# Patient Record
Sex: Female | Born: 1980 | Race: Black or African American | Hispanic: No | Marital: Single | Smoking: Current every day smoker
Health system: Southern US, Community
[De-identification: ages and names within clinical notes are randomized; demographics above are authoritative.]

## PROBLEM LIST (undated history)

## (undated) DIAGNOSIS — F419 Anxiety disorder, unspecified: Secondary | ICD-10-CM

## (undated) DIAGNOSIS — K649 Unspecified hemorrhoids: Secondary | ICD-10-CM

## (undated) HISTORY — DX: Unspecified hemorrhoids: K64.9

## (undated) HISTORY — PX: NO PAST SURGERIES: SHX2092

---
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
## 2004-10-12 ENCOUNTER — Ambulatory Visit (HOSPITAL_COMMUNITY): Admission: RE | Admit: 2004-10-12 | Discharge: 2004-10-12 | Payer: Self-pay | Admitting: *Deleted

## 2007-10-25 ENCOUNTER — Inpatient Hospital Stay (HOSPITAL_COMMUNITY): Admission: AD | Admit: 2007-10-25 | Discharge: 2007-10-26 | Payer: Self-pay | Admitting: Obstetrics and Gynecology

## 2008-05-30 ENCOUNTER — Inpatient Hospital Stay (HOSPITAL_COMMUNITY): Admission: AD | Admit: 2008-05-30 | Discharge: 2008-06-02 | Payer: Self-pay | Admitting: Obstetrics and Gynecology

## 2008-05-31 ENCOUNTER — Encounter (INDEPENDENT_AMBULATORY_CARE_PROVIDER_SITE_OTHER): Payer: Self-pay | Admitting: Obstetrics and Gynecology

## 2010-06-24 LAB — COMPREHENSIVE METABOLIC PANEL
AST: 21 U/L (ref 0–37)
Albumin: 2.2 g/dL — ABNORMAL LOW (ref 3.5–5.2)
Alkaline Phosphatase: 717 U/L — ABNORMAL HIGH (ref 39–117)
BUN: 5 mg/dL — ABNORMAL LOW (ref 6–23)
BUN: 6 mg/dL (ref 6–23)
CO2: 22 mEq/L (ref 19–32)
CO2: 23 mEq/L (ref 19–32)
Chloride: 107 mEq/L (ref 96–112)
Chloride: 109 mEq/L (ref 96–112)
Creatinine, Ser: 0.55 mg/dL (ref 0.4–1.2)
GFR calc Af Amer: >60 mL/min (ref >60)
GFR calc Af Amer: >60 mL/min (ref >60)
GFR calc non Af Amer: >60 mL/min (ref >60)
GFR calc non Af Amer: >60 mL/min (ref >60)
Glucose, Bld: 101 mg/dL — ABNORMAL HIGH (ref 70–99)
Glucose, Bld: 76 mg/dL (ref 70–99)
Glucose, Bld: 89 mg/dL (ref 70–99)
Potassium: 3.8 mEq/L (ref 3.5–5.1)
Potassium: 3.9 mEq/L (ref 3.5–5.1)
Sodium: 136 mEq/L (ref 135–145)
Sodium: 136 mEq/L (ref 135–145)
Total Bilirubin: 0.5 mg/dL (ref 0.3–1.2)
Total Protein: 5.7 g/dL — ABNORMAL LOW (ref 6.0–8.3)

## 2010-06-24 LAB — RPR: RPR Ser Ql: REACTIVE — AB

## 2010-06-24 LAB — URINALYSIS, ROUTINE W REFLEX MICROSCOPIC
Bilirubin Urine: NEGATIVE
Nitrite: NEGATIVE
Protein, ur: NEGATIVE mg/dL
Specific Gravity, Urine: 1.025 (ref 1.005–1.030)
pH: 6 (ref 5.0–8.0)

## 2010-06-24 LAB — RH IMMUNE GLOB WKUP(>/=20WKS)(NOT WOMEN'S HOSP): Fetal Screen: NEGATIVE

## 2010-06-24 LAB — CBC
Hemoglobin: 12.1 g/dL (ref 12.0–15.0)
Hemoglobin: 12.9 g/dL (ref 12.0–15.0)
Hemoglobin: 13.3 g/dL (ref 12.0–15.0)
MCHC: 33.7 g/dL (ref 30.0–36.0)
MCV: 93.6 fL (ref 78.0–100.0)
MCV: 94.1 fL (ref 78.0–100.0)
Platelets: 200 10*3/uL (ref 150–400)
Platelets: 221 10*3/uL (ref 150–400)
RDW: 13.9 % (ref 11.5–15.5)
WBC: 11.4 10*3/uL — ABNORMAL HIGH (ref 4.0–10.5)

## 2010-06-24 LAB — URIC ACID
Uric Acid, Serum: 4.4 mg/dL (ref 2.4–7.0)
Uric Acid, Serum: 5.3 mg/dL (ref 2.4–7.0)

## 2010-06-24 LAB — LACTATE DEHYDROGENASE: LDH: 283 U/L — ABNORMAL HIGH (ref 94–250)

## 2010-12-10 LAB — URINALYSIS, ROUTINE W REFLEX MICROSCOPIC
Glucose, UA: NEGATIVE
Ketones, ur: 15 — AB
Specific Gravity, Urine: 1.025

## 2010-12-10 LAB — POCT PREGNANCY, URINE: Preg Test, Ur: POSITIVE

## 2010-12-10 LAB — GC/CHLAMYDIA PROBE AMP, GENITAL: GC Probe Amp, Genital: NEGATIVE

## 2012-12-10 ENCOUNTER — Other Ambulatory Visit: Payer: Self-pay | Admitting: Obstetrics and Gynecology

## 2012-12-10 LAB — OB RESULTS CONSOLE GC/CHLAMYDIA
Chlamydia: NEGATIVE
Gonorrhea: NEGATIVE

## 2013-01-02 LAB — OB RESULTS CONSOLE HEPATITIS B SURFACE ANTIGEN: HEP B S AG: NEGATIVE

## 2013-01-02 LAB — OB RESULTS CONSOLE RUBELLA ANTIBODY, IGM: RUBELLA: IMMUNE

## 2013-01-02 LAB — OB RESULTS CONSOLE HIV ANTIBODY (ROUTINE TESTING): HIV: NONREACTIVE

## 2013-01-02 LAB — OB RESULTS CONSOLE RPR: RPR: NONREACTIVE

## 2013-02-13 ENCOUNTER — Other Ambulatory Visit (HOSPITAL_COMMUNITY): Payer: Self-pay | Admitting: Obstetrics and Gynecology

## 2013-02-13 ENCOUNTER — Ambulatory Visit (HOSPITAL_COMMUNITY)
Admission: RE | Admit: 2013-02-13 | Discharge: 2013-02-13 | Disposition: A | Discharge status: Home / Self Care | Payer: 59 | Source: Ambulatory Visit | Attending: Obstetrics and Gynecology | Admitting: Obstetrics and Gynecology

## 2013-02-13 DIAGNOSIS — R109 Unspecified abdominal pain: Secondary | ICD-10-CM

## 2013-02-13 DIAGNOSIS — Z3689 Encounter for other specified antenatal screening: Secondary | ICD-10-CM | POA: Insufficient documentation

## 2013-03-14 NOTE — L&D Delivery Note (Signed)
Delivery Note Patient pushed for 11 minutes after she was noted to be C/C/+2. At 2:51 PM a viable and healthy female was delivered via Vaginal, Spontaneous Delivery (Presentation: Left Occiput Anterior).  APGAR: 8, 9; weight .   Placenta status: Intact, Spontaneous.  Cord: 3 vessels with the following complications: none  Anesthesia: Epidural  Episiotomy: None Lacerations: None Suture Repair: n/a Est. Blood Loss (mL): 300  Mom to postpartum.  Baby to Nursery.  Chaselynn Kepple 06/30/2013, 3:15 PM

## 2013-04-17 LAB — OB RESULTS CONSOLE ABO/RH: RH TYPE: NEGATIVE

## 2013-04-17 LAB — OB RESULTS CONSOLE ANTIBODY SCREEN: ANTIBODY SCREEN: POSITIVE

## 2013-06-03 LAB — OB RESULTS CONSOLE GBS: GBS: NEGATIVE

## 2013-06-28 ENCOUNTER — Inpatient Hospital Stay (HOSPITAL_COMMUNITY)
Admission: AD | Admit: 2013-06-28 | Discharge: 2013-06-28 | Disposition: A | Discharge status: Home / Self Care | Payer: Medicaid Other | Source: Ambulatory Visit | Attending: Obstetrics & Gynecology | Admitting: Obstetrics & Gynecology

## 2013-06-28 ENCOUNTER — Encounter (HOSPITAL_COMMUNITY): Payer: Self-pay

## 2013-06-28 DIAGNOSIS — O228X9 Other venous complications in pregnancy, unspecified trimester: Secondary | ICD-10-CM | POA: Insufficient documentation

## 2013-06-28 DIAGNOSIS — O479 False labor, unspecified: Secondary | ICD-10-CM | POA: Insufficient documentation

## 2013-06-28 DIAGNOSIS — K649 Unspecified hemorrhoids: Secondary | ICD-10-CM | POA: Insufficient documentation

## 2013-06-28 DIAGNOSIS — O99330 Smoking (tobacco) complicating pregnancy, unspecified trimester: Secondary | ICD-10-CM | POA: Insufficient documentation

## 2013-06-28 DIAGNOSIS — O2243 Hemorrhoids in pregnancy, third trimester: Secondary | ICD-10-CM

## 2013-06-28 MED ORDER — HYDROCORTISONE ACE-PRAMOXINE 1-1 % RE FOAM
1.0000 | Freq: Two times a day (BID) | RECTAL | Status: DC
Start: 2013-06-28 — End: 2013-06-30

## 2013-06-28 MED ORDER — DOCUSATE SODIUM 100 MG PO CAPS: 100.0000 mg | ORAL_CAPSULE | Freq: Two times a day (BID) | ORAL | Status: DC | PRN

## 2013-06-28 NOTE — MAU Note (Signed)
Patient is not in the lobby when called to triage.  

## 2013-06-28 NOTE — MAU Provider Note (Signed)
Chief Complaint:  Hemorrhoids   First Provider Initiated Contact with Patient 06/28/13 1436      HPI: Joyce Mcclure is a 33 y.o. [id number] at 9138w4dwho presents to maternity admissions reporting severe hemorrhoids.  She reports she cannot sit down because of the pain.  Last BM 2 days ago, but she reports she was not constipated until after the hemorrhoids because she is afraid of the pain.  She reports good fetal movement, denies regular contractions, LOF, vaginal bleeding, vaginal itching/burning, urinary symptoms, h/a, dizziness, n/v, or fever/chills.     Past Medical History: History reviewed. No pertinent past medical history.  Past obstetric history: OB History  Gravida Para Term Preterm AB SAB TAB Ectopic Multiple Living  4 1 1  2 1 1   1     # Outcome Date GA Lbr Len/2nd Weight Sex Delivery Anes PTL Lv  4 CUR           3 TAB           2 SAB           1 TRM      SVD   Y      Past Surgical History: History reviewed. No pertinent past surgical history.  Family History: History reviewed. No pertinent family history.  Social History: History  Substance Use Topics  . Smoking status: Current Every Day Smoker  . Smokeless tobacco: Not on file  . Alcohol Use: No    Allergies: No Known Allergies  Meds:  Prescriptions prior to admission  Medication Sig Dispense Refill  . Lidocaine HCl 3 % GEL Apply 1 application topically See admin instructions. Apply 4 to 6 times daily as needed for hemorrhoids      . Prenatal Vit-Fe Fumarate-FA (PRENATAL MULTIVITAMIN) TABS tablet Take 1 tablet by mouth daily at 12 noon.        ROS: Pertinent findings in history of present illness.  Physical Exam  Blood pressure 124/68, pulse 85, temperature 98.9 F (37.2 C), temperature source Oral, resp. rate 16, height 5\' 5"  (1.651 m), weight 156 lb 9.6 oz (71.033 kg), SpO2 98.00%. GENERAL: Well-developed, well-nourished female in no acute distress.  HEENT: normocephalic HEART: normal rate RESP:  normal effort ABDOMEN: Soft, non-tender, gravid appropriate for gestational age EXTREMITIES: Nontender, no edema NEURO: alert and oriented Rectal - external hemorrhoids non-thrombosed    FHT:  Baseline 130, moderate variability, accelerations present, no decelerations Contractions: occasional    Assessment: 1. Hemorrhoids in pregnancy in third trimester     Plan: Consult Dr Mora ApplPinn Discharge home Labor precautions and fetal kick counts Proctofoam HC and Colace Rx to pharmacy F/U in office on Monday if symptoms persist or worsen      Follow-up Information   [facility] Sanjuana MaeWALDA STACIA, MD. (Call office on Monday if symptoms persist. )    Specialty:  Obstetrics and Gynecology   Contact information:   [address] [place] [address] [phone]        Medication List    STOP taking these medications       Lidocaine HCl 3 % Gel      TAKE these medications       docusate sodium 100 MG capsule  Commonly known as:  COLACE  Take 1 capsule (100 mg total) by mouth 2 (two) times daily as needed.     hydrocortisone-pramoxine rectal foam  Commonly known as:  PROCTOFOAM HC  Place 1 applicator rectally 2 (two) times daily.  prenatal multivitamin Tabs tablet  Take 1 tablet by mouth daily at 12 noon.        Sharen CounterLisa Leftwich-Kirby Certified Nurse-Midwife 06/28/2013 3:42 PM

## 2013-06-28 NOTE — MAU Note (Signed)
Patient states she has had hemorrhoids for a few years but worse with the pregnancy. Was seen in the office yesterday and given Lidocaine gel that does not help. Denies bleeding, leaking and has irregular mild contractions. Reports good fetal movement.

## 2013-06-28 NOTE — Discharge Instructions (Signed)
Hemorrhoids °Hemorrhoids are swollen veins around the rectum or anus. There are two types of hemorrhoids:  °· Internal hemorrhoids. These occur in the veins just inside the rectum. They may poke through to the outside and become irritated and painful. °· External hemorrhoids. These occur in the veins outside the anus and can be felt as a painful swelling or hard lump near the anus. °CAUSES °· Pregnancy.   °· Obesity.   °· Constipation or diarrhea.   °· Straining to have a bowel movement.   °· Sitting for long periods on the toilet. °· Heavy lifting or other activity that caused you to strain. °· Anal intercourse. °SYMPTOMS  °· Pain.   °· Anal itching or irritation.   °· Rectal bleeding.   °· Fecal leakage.   °· Anal swelling.   °· One or more lumps around the anus.   °DIAGNOSIS  °Your caregiver may be able to diagnose hemorrhoids by visual examination. Other examinations or tests that may be performed include:  °· Examination of the rectal area with a gloved hand (digital rectal exam).   °· Examination of anal canal using a small tube (scope).   °· A blood test if you have lost a significant amount of blood. °· A test to look inside the colon (sigmoidoscopy or colonoscopy). °TREATMENT °Most hemorrhoids can be treated at home. However, if symptoms do not seem to be getting better or if you have a lot of rectal bleeding, your caregiver may perform a procedure to help make the hemorrhoids get smaller or remove them completely. Possible treatments include:  °· Placing a rubber band at the base of the hemorrhoid to cut off the circulation (rubber band ligation).   °· Injecting a chemical to shrink the hemorrhoid (sclerotherapy).   °· Using a tool to burn the hemorrhoid (infrared light therapy).   °· Surgically removing the hemorrhoid (hemorrhoidectomy).   °· Stapling the hemorrhoid to block blood flow to the tissue (hemorrhoid stapling).   °HOME CARE INSTRUCTIONS  °· Eat foods with fiber, such as whole grains, beans,  nuts, fruits, and vegetables. Ask your doctor about taking products with added fiber in them (fiber supplements). °· Increase fluid intake. Drink enough water and fluids to keep your urine clear or pale yellow.   °· Exercise regularly.   °· Go to the bathroom when you have the urge to have a bowel movement. Do not wait.   °· Avoid straining to have bowel movements.   °· Keep the anal area dry and clean. Use wet toilet paper or moist towelettes after a bowel movement.   °· Medicated creams and suppositories may be used or applied as directed.   °· Only take over-the-counter or prescription medicines as directed by your caregiver.   °· Take warm sitz baths for 15 20 minutes, 3 4 times a day to ease pain and discomfort.   °· Place ice packs on the hemorrhoids if they are tender and swollen. Using ice packs between sitz baths may be helpful.   °· Put ice in a plastic bag.   °· Place a towel between your skin and the bag.   °· Leave the ice on for 15 20 minutes, 3 4 times a day.   °· Do not use a donut-shaped pillow or sit on the toilet for long periods. This increases blood pooling and pain.   °SEEK MEDICAL CARE IF: °· You have increasing pain and swelling that is not controlled by treatment or medicine. °· You have uncontrolled bleeding. °· You have difficulty or you are unable to have a bowel movement. °· You have pain or inflammation outside the area of the hemorrhoids. °MAKE SURE YOU: °·   Understand these instructions.  Will watch your condition.  Will get help right away if you are not doing well or get worse. Document Released: 02/26/2000 Document Revised: 02/15/2012 Document Reviewed: 01/03/2012 [facility] Patient Information 2014 [place], [place].  Fiber Content in Foods Drinking plenty of fluids and consuming foods high in fiber can help with constipation. See the list below for the fiber content of some common foods. Starches and Grains / Dietary Fiber (g)  Raisin Bran, 1 cup/ 7 g  Cheerios, 1 cup /  3 g  Kellogg's Corn Flakes, 1 cup / 0.7 g  Rice Krispies, 1  cup / 0.3 g  Quaker Oat Life Cereal,  cup / 2.1 g  Oatmeal, instant (cooked),  cup / 2 g  Kellogg's Frosted Mini Wheats, 1 cup / 5.1 g  Rice, brown, long-grain (cooked), 1 cup / 3.5 g  Rice, white, long-grain (cooked), 1 cup / 0.6 g  Macaroni, cooked, enriched, 1 cup / 2.5 g Legumes / Dietary Fiber (g)  Beans, baked, canned, plain or vegetarian,  cup / 5.2 g  Beans, kidney, canned,  cup / 6.8 g  Beans, pinto, dried (cooked),  cup / 7.7 g  Beans, pinto, canned,  cup / 5.5 g Breads and Crackers / Dietary Fiber (g)  Graham crackers, plain or honey, 2 squares / 0.7 g  Saltine crackers, 3 squares / 0.3 g  Pretzels, plain, salted, 10 pieces / 1.8 g  Bread, whole-wheat, 1 slice / 1.9 g  Bread, white, 1 slice / 0.7 g  Bread, raisin, 1 slice / 1.2 g  Bagel, plain, 3 oz / 2 g  Tortilla, flour, 1 oz / 0.9 g  Tortilla, corn, 1 small / 1.5 g  Bun, hamburger or hotdog, 1 small / 0.9 g Fruits / Dietary Fiber (g)  Apple, raw with skin, 1 medium / 4.4 g  Applesauce, sweetened,  cup / 1.5 g  Banana,  medium / 1.5 g  Grapes, 10 grapes / 0.4 g  Orange, 1 small / 2.3 g  Raisin, 1.5 oz / 1.6 g  Melon, 1 cup / 1.4 g Vegetables / Dietary Fiber (g)  Green beans, canned,  cup / 1.3 g  Carrots (cooked),  cup / 2.3 g  Broccoli (cooked),  cup / 2.8 g  Peas, frozen (cooked),  cup / 4.4 g  Potatoes, mashed,  cup / 1.6 g  Lettuce, 1 cup / 0.5 g  Corn, canned,  cup / 1.6 g  Tomato,  cup / 1.1 g Document Released: 07/17/2006 Document Revised: 05/23/2011 Document Reviewed: 09/11/2006 ExitCare Patient Information 2014 [employer], [place].

## 2013-06-29 ENCOUNTER — Inpatient Hospital Stay (HOSPITAL_COMMUNITY)
Admission: AD | Admit: 2013-06-29 | Discharge: 2013-07-02 | DRG: 775 | Disposition: A | Discharge status: Home / Self Care | Payer: Medicaid Other | Source: Ambulatory Visit | Attending: Obstetrics & Gynecology | Admitting: Obstetrics & Gynecology

## 2013-06-29 ENCOUNTER — Encounter (HOSPITAL_COMMUNITY): Payer: Self-pay

## 2013-06-29 DIAGNOSIS — K649 Unspecified hemorrhoids: Secondary | ICD-10-CM | POA: Diagnosis present

## 2013-06-29 DIAGNOSIS — O99334 Smoking (tobacco) complicating childbirth: Secondary | ICD-10-CM | POA: Diagnosis present

## 2013-06-29 DIAGNOSIS — O360990 Maternal care for other rhesus isoimmunization, unspecified trimester, not applicable or unspecified: Secondary | ICD-10-CM | POA: Diagnosis present

## 2013-06-29 DIAGNOSIS — IMO0001 Reserved for inherently not codable concepts without codable children: Secondary | ICD-10-CM

## 2013-06-29 DIAGNOSIS — O878 Other venous complications in the puerperium: Secondary | ICD-10-CM | POA: Diagnosis present

## 2013-06-29 NOTE — MAU Note (Signed)
Contractions 4-6 min apart for past hour, ctxs through day since mucus plug came out this morning.  Pinkish spotting since plug.  Reports good baby movement.

## 2013-06-29 NOTE — MAU Provider Note (Signed)
Discussed case with CNM I agree with above.   Joyce DienerWalda Briget Shaheed

## 2013-06-30 ENCOUNTER — Encounter (HOSPITAL_COMMUNITY): Payer: Self-pay | Admitting: *Deleted

## 2013-06-30 ENCOUNTER — Encounter (HOSPITAL_COMMUNITY): Payer: Medicaid Other | Admitting: Anesthesiology

## 2013-06-30 ENCOUNTER — Inpatient Hospital Stay (HOSPITAL_COMMUNITY): Payer: Medicaid Other | Admitting: Anesthesiology

## 2013-06-30 DIAGNOSIS — IMO0001 Reserved for inherently not codable concepts without codable children: Secondary | ICD-10-CM

## 2013-06-30 LAB — CBC
HEMATOCRIT: 36.3 % (ref 36.0–46.0)
Hemoglobin: 12.5 g/dL (ref 12.0–15.0)
MCH: 30.6 pg (ref 26.0–34.0)
MCHC: 34.4 g/dL (ref 30.0–36.0)
MCV: 88.8 fL (ref 78.0–100.0)
Platelets: 283 10*3/uL (ref 150–400)
RBC: 4.09 MIL/uL (ref 3.87–5.11)
RDW: 13.9 % (ref 11.5–15.5)
WBC: 14.8 10*3/uL — ABNORMAL HIGH (ref 4.0–10.5)

## 2013-06-30 LAB — RPR

## 2013-06-30 MED ORDER — CITRIC ACID-SODIUM CITRATE 334-500 MG/5ML PO SOLN: 30.0000 mL | ORAL | Status: DC | PRN

## 2013-06-30 MED ORDER — FENTANYL CITRATE 0.05 MG/ML IJ SOLN
100.0000 ug | INTRAMUSCULAR | Status: DC | PRN
  Administered 2013-06-30 (×3): 100 ug via INTRAVENOUS
  Filled 2013-06-30 (×3): qty 2

## 2013-06-30 MED ORDER — LANOLIN HYDROUS EX OINT: TOPICAL_OINTMENT | CUTANEOUS | Status: DC | PRN

## 2013-06-30 MED ORDER — TETANUS-DIPHTH-ACELL PERTUSSIS 5-2.5-18.5 LF-MCG/0.5 IM SUSP: 0.5000 mL | Freq: Once | INTRAMUSCULAR | Status: DC

## 2013-06-30 MED ORDER — LACTATED RINGERS IV SOLN
INTRAVENOUS | Status: DC
  Administered 2013-06-30 (×2): via INTRAVENOUS

## 2013-06-30 MED ORDER — LACTATED RINGERS IV SOLN: 500.0000 mL | INTRAVENOUS | Status: DC | PRN

## 2013-06-30 MED ORDER — BENZOCAINE-MENTHOL 20-0.5 % EX AERO
1.0000 "application " | INHALATION_SPRAY | CUTANEOUS | Status: DC | PRN
  Administered 2013-06-30: 1 via TOPICAL
  Filled 2013-06-30: qty 56

## 2013-06-30 MED ORDER — HYDROCORTISONE ACE-PRAMOXINE 1-1 % RE FOAM
1.0000 | Freq: Two times a day (BID) | RECTAL | Status: DC | PRN
  Administered 2013-07-01: 1 via RECTAL
  Filled 2013-06-30: qty 10

## 2013-06-30 MED ORDER — OXYTOCIN 40 UNITS IN LACTATED RINGERS INFUSION - SIMPLE MED
62.5000 mL/h | INTRAVENOUS | Status: DC
  Filled 2013-06-30: qty 1000

## 2013-06-30 MED ORDER — SODIUM CHLORIDE 0.9 % IV SOLN
INTRAVENOUS | Status: DC
  Administered 2013-06-30: 12:00:00 via EPIDURAL
  Filled 2013-06-30 (×3): qty 20

## 2013-06-30 MED ORDER — PRAMOXINE HCL 1 % RE FOAM: Freq: Three times a day (TID) | RECTAL | Status: DC | PRN

## 2013-06-30 MED ORDER — DIBUCAINE 1 % RE OINT
1.0000 "application " | TOPICAL_OINTMENT | RECTAL | Status: DC | PRN
  Administered 2013-06-30: 1 via RECTAL
  Filled 2013-06-30: qty 28

## 2013-06-30 MED ORDER — IBUPROFEN 600 MG PO TABS: 600.0000 mg | ORAL_TABLET | Freq: Four times a day (QID) | ORAL | Status: DC | PRN

## 2013-06-30 MED ORDER — DIPHENHYDRAMINE HCL 50 MG/ML IJ SOLN
12.5000 mg | INTRAMUSCULAR | Status: DC | PRN
  Administered 2013-06-30 (×2): 12.5 mg via INTRAVENOUS
  Filled 2013-06-30: qty 1

## 2013-06-30 MED ORDER — ONDANSETRON HCL 4 MG/2ML IJ SOLN: 4.0000 mg | Freq: Four times a day (QID) | INTRAMUSCULAR | Status: DC | PRN

## 2013-06-30 MED ORDER — PHENYLEPHRINE 40 MCG/ML (10ML) SYRINGE FOR IV PUSH (FOR BLOOD PRESSURE SUPPORT)
80.0000 ug | PREFILLED_SYRINGE | INTRAVENOUS | Status: DC | PRN
  Filled 2013-06-30: qty 2
  Filled 2013-06-30: qty 10

## 2013-06-30 MED ORDER — DIPHENHYDRAMINE HCL 25 MG PO CAPS: 25.0000 mg | ORAL_CAPSULE | Freq: Four times a day (QID) | ORAL | Status: DC | PRN

## 2013-06-30 MED ORDER — FENTANYL 2.5 MCG/ML BUPIVACAINE 1/10 % EPIDURAL INFUSION (WH - ANES)
INTRAMUSCULAR | Status: DC | PRN
  Administered 2013-06-30: 14 mL/h via EPIDURAL

## 2013-06-30 MED ORDER — IBUPROFEN 600 MG PO TABS
600.0000 mg | ORAL_TABLET | Freq: Four times a day (QID) | ORAL | Status: DC
  Administered 2013-06-30 – 2013-07-02 (×7): 600 mg via ORAL
  Filled 2013-06-30 (×7): qty 1

## 2013-06-30 MED ORDER — WITCH HAZEL-GLYCERIN EX PADS
1.0000 "application " | MEDICATED_PAD | CUTANEOUS | Status: DC | PRN
  Administered 2013-06-30: 1 via TOPICAL

## 2013-06-30 MED ORDER — PHENYLEPHRINE 40 MCG/ML (10ML) SYRINGE FOR IV PUSH (FOR BLOOD PRESSURE SUPPORT)
80.0000 ug | PREFILLED_SYRINGE | INTRAVENOUS | Status: DC | PRN
  Filled 2013-06-30: qty 2

## 2013-06-30 MED ORDER — PNEUMOCOCCAL VAC POLYVALENT 25 MCG/0.5ML IJ INJ
0.5000 mL | INJECTION | INTRAMUSCULAR | Status: DC
  Filled 2013-06-30: qty 0.5

## 2013-06-30 MED ORDER — FLEET ENEMA 7-19 GM/118ML RE ENEM
1.0000 | ENEMA | RECTAL | Status: DC | PRN
Start: 2013-06-30 — End: 2013-06-30

## 2013-06-30 MED ORDER — SENNOSIDES-DOCUSATE SODIUM 8.6-50 MG PO TABS
2.0000 | ORAL_TABLET | ORAL | Status: DC
  Administered 2013-07-01 (×2): 2 via ORAL
  Filled 2013-06-30 (×2): qty 2

## 2013-06-30 MED ORDER — LACTATED RINGERS IV SOLN: INTRAVENOUS | Status: DC

## 2013-06-30 MED ORDER — OXYCODONE-ACETAMINOPHEN 5-325 MG PO TABS
1.0000 | ORAL_TABLET | ORAL | Status: DC | PRN
  Administered 2013-06-30: 1 via ORAL
  Administered 2013-07-01: 2 via ORAL
  Administered 2013-07-01: 1 via ORAL
  Administered 2013-07-02: 2 via ORAL
  Filled 2013-06-30: qty 2
  Filled 2013-06-30: qty 1
  Filled 2013-06-30 (×2): qty 2

## 2013-06-30 MED ORDER — BUTORPHANOL TARTRATE 1 MG/ML IJ SOLN
2.0000 mg | Freq: Once | INTRAMUSCULAR | Status: AC
  Administered 2013-06-30: 1 mg via INTRAMUSCULAR

## 2013-06-30 MED ORDER — ONDANSETRON HCL 4 MG/2ML IJ SOLN: 4.0000 mg | INTRAMUSCULAR | Status: DC | PRN

## 2013-06-30 MED ORDER — LIDOCAINE HCL (PF) 1 % IJ SOLN
INTRAMUSCULAR | Status: DC | PRN
  Administered 2013-06-30 (×2): 9 mL

## 2013-06-30 MED ORDER — OXYCODONE-ACETAMINOPHEN 5-325 MG PO TABS: 1.0000 | ORAL_TABLET | ORAL | Status: DC | PRN

## 2013-06-30 MED ORDER — LACTATED RINGERS IV SOLN
500.0000 mL | Freq: Once | INTRAVENOUS | Status: AC
  Administered 2013-06-30: 500 mL via INTRAVENOUS

## 2013-06-30 MED ORDER — FENTANYL 2.5 MCG/ML BUPIVACAINE 1/10 % EPIDURAL INFUSION (WH - ANES)
14.0000 mL/h | INTRAMUSCULAR | Status: DC | PRN
  Administered 2013-06-30: 14 mL/h via EPIDURAL
  Filled 2013-06-30: qty 125

## 2013-06-30 MED ORDER — OXYTOCIN BOLUS FROM INFUSION
500.0000 mL | INTRAVENOUS | Status: DC
  Administered 2013-06-30: 500 mL via INTRAVENOUS

## 2013-06-30 MED ORDER — EPHEDRINE 5 MG/ML INJ
10.0000 mg | INTRAVENOUS | Status: DC | PRN
  Filled 2013-06-30: qty 2
  Filled 2013-06-30: qty 4

## 2013-06-30 MED ORDER — PRENATAL MULTIVITAMIN CH
1.0000 | ORAL_TABLET | Freq: Every day | ORAL | Status: DC
  Administered 2013-07-01: 1 via ORAL
  Filled 2013-06-30: qty 1

## 2013-06-30 MED ORDER — BUTORPHANOL TARTRATE 1 MG/ML IJ SOLN
INTRAMUSCULAR | Status: AC
  Filled 2013-06-30: qty 1

## 2013-06-30 MED ORDER — SIMETHICONE 80 MG PO CHEW: 80.0000 mg | CHEWABLE_TABLET | ORAL | Status: DC | PRN

## 2013-06-30 MED ORDER — EPHEDRINE 5 MG/ML INJ
10.0000 mg | INTRAVENOUS | Status: DC | PRN
  Filled 2013-06-30: qty 2

## 2013-06-30 MED ORDER — LIDOCAINE HCL (PF) 1 % IJ SOLN
30.0000 mL | INTRAMUSCULAR | Status: DC | PRN
  Filled 2013-06-30: qty 30

## 2013-06-30 MED ORDER — ACETAMINOPHEN 325 MG PO TABS: 650.0000 mg | ORAL_TABLET | ORAL | Status: DC | PRN

## 2013-06-30 MED ORDER — ZOLPIDEM TARTRATE 5 MG PO TABS: 5.0000 mg | ORAL_TABLET | Freq: Every evening | ORAL | Status: DC | PRN

## 2013-06-30 MED ORDER — ONDANSETRON HCL 4 MG PO TABS
4.0000 mg | ORAL_TABLET | ORAL | Status: DC | PRN
Start: 2013-06-30 — End: 2013-07-02

## 2013-06-30 NOTE — Anesthesia Preprocedure Evaluation (Signed)
Anesthesia Evaluation  Patient identified by MRN, date of birth, ID band Patient awake    Reviewed: Allergy & Precautions, H&P , NPO status , Patient's Chart, lab work & pertinent test results  Airway Mallampati: I TM Distance: >3 FB Neck ROM: full    Dental no notable dental hx.    Pulmonary Current Smoker,    Pulmonary exam normal       Cardiovascular negative cardio ROS      Neuro/Psych negative neurological ROS  negative psych ROS   GI/Hepatic negative GI ROS, Neg liver ROS,   Endo/Other  negative endocrine ROS  Renal/GU negative Renal ROS     Musculoskeletal   Abdominal Normal abdominal exam  (+)   Peds  Hematology negative hematology ROS (+)   Anesthesia Other Findings   Reproductive/Obstetrics (+) Pregnancy                           Anesthesia Physical Anesthesia Plan  ASA: II  Anesthesia Plan: Epidural   Post-op Pain Management:    Induction:   Airway Management Planned:   Additional Equipment:   Intra-op Plan:   Post-operative Plan:   Informed Consent: I have reviewed the patients History and Physical, chart, labs and discussed the procedure including the risks, benefits and alternatives for the proposed anesthesia with the patient or authorized representative who has indicated his/her understanding and acceptance.     Plan Discussed with:   Anesthesia Plan Comments:         Anesthesia Quick Evaluation  

## 2013-06-30 NOTE — Anesthesia Procedure Notes (Signed)
Epidural Patient location during procedure: OB Start time: 06/30/2013 7:23 AM End time: 06/30/2013 7:27 AM  Staffing Anesthesiologist: Leilani AbleHATCHETT, Adelma Bowdoin Performed by: anesthesiologist   Preanesthetic Checklist Completed: patient identified, surgical consent, pre-op evaluation, timeout performed, IV checked, risks and benefits discussed and monitors and equipment checked  Epidural Patient position: sitting Prep: site prepped and draped and DuraPrep Patient monitoring: continuous pulse ox and blood pressure Approach: midline Location: L3-L4 Injection technique: LOR air  Needle:  Needle type: Tuohy  Needle gauge: 17 G Needle length: 9 cm and 9 Needle insertion depth: 6 cm Catheter type: closed end flexible Catheter size: 19 Gauge Catheter at skin depth: 11 cm Test dose: negative and Other  Assessment Sensory level: T9 Events: blood not aspirated, injection not painful, no injection resistance, negative IV test and paresthesia  Additional Notes R leg X 2Reason for block:procedure for pain

## 2013-06-30 NOTE — Progress Notes (Signed)
Joyce Mcclure is a 33 y.o. [id number] at 3438w6d by LMP admitted for induction of labor due to Non-reactive NST.  Subjective:  Patient now comfortable with epidural  Objective: BP 113/75  Pulse 86  Temp(Src) 97.6 F (36.4 C) (Oral)  Resp 20  Ht 5\' 5"  (1.651 m)  Wt 69.854 kg (154 lb)  BMI 25.63 kg/m2  SpO2 99%      FHT:  FHR: 120 bpm, variability: moderate,  accelerations:  Present,  decelerations:  Absent UC:   regular, every 1-2 minutes SVE:   Dilation: 3.5 Effacement (%): 50 Station: -2 Exam by:: Joyce Mcclure  Labs: Lab Results  Component Value Date   WBC 14.8* 06/30/2013   HGB 12.5 06/30/2013   HCT 36.3 06/30/2013   MCV 88.8 06/30/2013   PLT 283 06/30/2013    Assessment / Plan: Spontaneous labor, progressing normally  - Early latent phase FHT Cat I now  Labor: Will AROM once in active labor Preeclampsia:  no signs or symptoms of toxicity Fetal Wellbeing:  Category I Pain Control:  Epidural I/D:  n/a Anticipated MOD:  NSVD  Joyce Mcclure 06/30/2013, 8:59 AM

## 2013-06-30 NOTE — H&P (Signed)
Joyce Mcclure is a 33 y.o. female admitted to Labor and Delivery for contractions, severe hemorrhoid pain.  She denies LOF, no vaginal bleeding, normal fetal movements.   Maternal Medical History:  Reason for admission: Contractions.  Nausea.  Contractions: Onset was 3-5 hours ago.   Frequency: regular.   Perceived severity is moderate.    Fetal activity: Perceived fetal activity is normal.    Prenatal complications: No bleeding or IUGR.     OB History   Grav Para Term Preterm Abortions TAB SAB Ect Mult Living   4 1 1  2 1 1   1      History reviewed. No pertinent past medical history. Past Surgical History  Procedure Laterality Date  . No past surgeries     Family History: family history is not on file. Social History:  reports that she has been smoking Cigarettes.  She has been smoking about 0.25 packs per day. She has never used smokeless tobacco. She reports that she does not drink alcohol or use illicit drugs.   Prenatal Transfer Tool  Maternal Diabetes: No Genetic Screening: Normal Maternal Ultrasounds/Referrals: Normal Fetal Ultrasounds or other Referrals:  None Maternal Substance Abuse:  No Significant Maternal Medications:  None Significant Maternal Lab Results:  Lab values include: Rh negative Other Comments:  None  Review of Systems  Constitutional: Negative for fever and chills.  HENT: Negative for hearing loss.   Eyes: Negative for blurred vision.  Cardiovascular: Negative for chest pain.  Gastrointestinal: Negative for heartburn and nausea.       Hemorrhoids  Skin: Negative for rash.  Neurological: Negative for dizziness and headaches.  Endo/Heme/Allergies: Does not bruise/bleed easily.    Dilation: 3 Effacement (%): 50 Station: -2 Exam by:: k fields, rn Blood pressure 124/67, pulse 69, temperature 97.6 F (36.4 C), temperature source Oral, resp. rate 20, height 5\' 5"  (1.651 m), weight 69.854 kg (154 lb), SpO2 100.00%. Maternal Exam:  Uterine  Assessment: Contraction strength is moderate.  Contraction duration is 1 minute. Contraction frequency is regular.   Abdomen: Patient reports no abdominal tenderness. Fundal height is 38 cm.   Estimated fetal weight is 3200 grams.   Fetal presentation: vertex  Introitus: Normal vulva. Normal vagina.  Ferning test: not done.  Nitrazine test: not done. Amniotic fluid character: not assessed.  Pelvis: adequate for delivery.   Cervix: Cervix evaluated by digital exam.   CNM: 2-3/ thick  Physical Exam  Constitutional: She appears well-developed and well-nourished.  HENT:  Head: Normocephalic.  Cardiovascular: Normal rate and regular rhythm.   Respiratory: Effort normal.    NST: baseline 145 moderate variability accels recurrent variable decels in MAU TOCO: Ctx q 2min  Prenatal labs: ABO, Rh: O/Negative/-- (02/04 0000) Antibody: Positive (02/04 0000) Rubella: Immune (10/22 0000) RPR: Nonreactive (10/22 0000)  HBsAg: Negative (10/22 0000)  HIV: Non-reactive (10/22 0000)  GBS: Negative (03/23 0000)   Assessment/Plan: SIUP at term in early labor with Cat II tracing in MAU Will induce labor if she does not progress normally  Continuous monitoring Epidural when she desires   [name] 06/30/2013, 8:52 AM

## 2013-06-30 NOTE — Progress Notes (Signed)
Joyce Mcclure is a 33 y.o. [id number] at 4861w6d by LMP admitted for induction of labor due to Non-reactive NST.  Subjective: Patient feeling more pressure  Objective: BP 122/90  Pulse 67  Temp(Src) 97.6 F (36.4 C) (Oral)  Resp 20  Ht 5\' 5"  (1.651 m)  Wt 69.854 kg (154 lb)  BMI 25.63 kg/m2  SpO2 99%      FHT:  FHR: 120 bpm, variability: moderate,  accelerations:  Present,  decelerations:  Present variable decels with contraction UC:   regular, every 2 minutes SVE:   Dilation: 9 Effacement (%): 100 Station: 0 Exam AV:WUJWby:Joyce Mcclure, W.  AROM: Clear fluid  Labs: Lab Results  Component Value Date   WBC 14.8* 06/30/2013   HGB 12.5 06/30/2013   HCT 36.3 06/30/2013   MCV 88.8 06/30/2013   PLT 283 06/30/2013    Assessment / Plan: Spontaneous labor, progressing normally  Labor: Progressing normally Preeclampsia:  no signs or symptoms of toxicity Fetal Wellbeing:  Category II Pain Control:  Epidural I/D:  n/a Anticipated MOD:  NSVD  Otis Portal 06/30/2013, 2:20 PM

## 2013-07-01 LAB — CBC
HCT: 32.6 % — ABNORMAL LOW (ref 36.0–46.0)
Hemoglobin: 11 g/dL — ABNORMAL LOW (ref 12.0–15.0)
MCH: 30.3 pg (ref 26.0–34.0)
MCHC: 33.7 g/dL (ref 30.0–36.0)
MCV: 89.8 fL (ref 78.0–100.0)
PLATELETS: 228 10*3/uL (ref 150–400)
RBC: 3.63 MIL/uL — AB (ref 3.87–5.11)
RDW: 14 % (ref 11.5–15.5)
WBC: 12.6 10*3/uL — ABNORMAL HIGH (ref 4.0–10.5)

## 2013-07-01 LAB — ABO/RH: ABO/RH(D): O NEG

## 2013-07-01 MED ORDER — RHO D IMMUNE GLOBULIN 1500 UNIT/2ML IJ SOLN
300.0000 ug | Freq: Once | INTRAMUSCULAR | Status: AC
  Administered 2013-07-01: 300 ug via INTRAMUSCULAR
  Filled 2013-07-01: qty 2

## 2013-07-01 MED ORDER — HYDROCORTISONE ACE-PRAMOXINE 1-1 % RE FOAM: 1.0000 | Freq: Two times a day (BID) | RECTAL | Status: DC

## 2013-07-01 NOTE — Progress Notes (Signed)
Post Partum Day 1 Subjective: Pt c/o pain from her hemorrhoids. Has not used proctofoam yet  Objective: Blood pressure 105/82, pulse 71, temperature 97.9 F (36.6 C), temperature source Oral, resp. rate 18, height 5\' 5"  (1.651 m), weight 69.854 kg (154 lb), SpO2 100.00%, unknown if currently breastfeeding.  Physical Exam:  General: alert, cooperative and appears stated age Lochia: appropriate Uterine Fundus: firm   Recent Labs  06/30/13 0240 07/01/13 0536  HGB 12.5 11.0*  HCT 36.3 32.6*    Assessment/Plan: Plan for discharge tomorrow   LOS: 2 days   Beauford Lando H. Brenisha Tsui 07/01/2013, 9:45 AM

## 2013-07-01 NOTE — Lactation Note (Signed)
This note was copied from the chart of Joyce Austin MilesSheree Freese. Lactation Consultation Note Follow up consult: Baby latched easily in cross cradle, reviewed positioning. Encouraged mother to bring baby to her.  Baby latched easily sucks and swallows observed for 5 min. Reviewed waking techniques and breast massage to keep baby stimulated at the breast. Mother is going to eat lunch and try again after lunch. Encouraged her to call if further assistance is needed.  Patient Name: Joyce Mcclure [id number]'s Date: 07/01/2013 Reason for consult: Follow-up assessment   Maternal Data    Feeding Feeding Type: Breast Fed Length of feed: 5 min  LATCH Score/Interventions Latch: Grasps breast easily, tongue down, lips flanged, rhythmical sucking. Intervention(s): Adjust position;Assist with latch;Breast massage;Breast compression  Audible Swallowing: A few with stimulation Intervention(s): Alternate breast massage  Type of Nipple: Everted at rest and after stimulation  Comfort (Breast/Nipple): Soft / non-tender     Hold (Positioning): Assistance needed to correctly position infant at breast and maintain latch.  LATCH Score: 8  Lactation Tools Discussed/Used     Consult Status Consult Status: Follow-up Date: 07/02/13 Follow-up type: In-patient    Dulce SellarRuth Boschen Jannett Schmall 07/01/2013, 2:04 PM

## 2013-07-01 NOTE — Lactation Note (Signed)
This note was copied from the chart of Joyce Austin MilesSheree Beitz. Lactation Consultation Note Mom BF baby in cradle position. Noted good latch. Discussed different positions, Mom encouraged to feed baby 8-12 times/24 hours and with feeding cues. Mom encouraged to feed baby w/feeding cuesReviewed Baby & Me book's Breastfeeding Basics. Mom made aware of O/P services, breastfeeding support groups, community resources, and our phone # for post-discharge questions. Mother informed of post-discharge support and given phone number to the lactation department, including services for phone call assistance; out-patient appointments; and breastfeeding support group. List of other breastfeeding resources in the community given in the handout. Encouraged mother to call for problems or concerns related to breastfeeding.Specifics of an asymmetric latch shown. Encouraged comfort during BF so colostrum flows better and mom will enjoy the feeding longer. Taking deep breaths and breast massage during BF. Encouraged to call for assistance if needed and to verify proper latch. Patient Name: Joyce Mcclure UEAVW'UToday's Date: 07/01/2013 Reason for consult: Initial assessment   Maternal Data Has patient been taught Hand Expression?: Yes Does the patient have breastfeeding experience prior to this delivery?: Yes  Feeding Feeding Type: Breast Fed Length of feed: 30 min  LATCH Score/Interventions Latch: Grasps breast easily, tongue down, lips flanged, rhythmical sucking. Intervention(s): Adjust position;Breast massage  Audible Swallowing: A few with stimulation Intervention(s): Hand expression  Type of Nipple: Everted at rest and after stimulation  Comfort (Breast/Nipple): Soft / non-tender     Hold (Positioning): Assistance needed to correctly position infant at breast and maintain latch. Intervention(s): Breastfeeding basics reviewed;Support Pillows;Position options  LATCH Score: 8  Lactation Tools Discussed/Used     Consult Status Consult Status: Follow-up Date: 07/01/13 Follow-up type: In-patient    Charyl DancerLaura G Ciarrah Rae 07/01/2013, 4:31 AM

## 2013-07-01 NOTE — Anesthesia Postprocedure Evaluation (Signed)
Anesthesia Post Note  Patient: Joyce Mcclure  Procedure(s) Performed: * No procedures listed *  Anesthesia type: Epidural  Patient location: Mother/Baby  Post pain: Pain level controlled  Post assessment: Post-op Vital signs reviewed  Last Vitals:  Filed Vitals:   07/01/13 0600  BP: 105/82  Pulse: 71  Temp: 36.6 C  Resp: 18    Post vital signs: Reviewed  Level of consciousness:alert  Complications: No apparent anesthesia complications

## 2013-07-01 NOTE — Progress Notes (Signed)
Ur chart review completed.  

## 2013-07-02 LAB — RH IG WORKUP (INCLUDES ABO/RH)
ABO/RH(D): O NEG
ANTIBODY SCREEN: POSITIVE
DAT, IgG: NEGATIVE
Fetal Screen: NEGATIVE
Gestational Age(Wks): 38
UNIT DIVISION: 0

## 2013-07-02 MED ORDER — HYDROCORTISONE ACE-PRAMOXINE 1-1 % RE FOAM: 1.0000 | Freq: Two times a day (BID) | RECTAL | Status: DC

## 2013-07-02 NOTE — Discharge Summary (Signed)
Obstetric Discharge Summary Reason for Admission: onset of labor Prenatal Procedures: none Intrapartum Procedures: spontaneous vaginal delivery Postpartum Procedures: Rho(D) Ig Complications-Operative and Postpartum: none Hemoglobin  Date Value Ref Range Status  07/01/2013 11.0* 12.0 - 15.0 g/dL Final     HCT  Date Value Ref Range Status  07/01/2013 32.6* 36.0 - 46.0 % Final    Discharge Diagnoses: Term Pregnancy-delivered  Discharge Information: Date: 07/02/2013 Activity: pelvic rest Diet: routine Medications: Ibuprofen and proctofoam Condition: stable Instructions: refer to practice specific booklet Discharge to: home Follow-up Information   Follow up with PINN, Sanjuana MaeWALDA STACIA, MD In 4 weeks.   Specialty:  Obstetrics and Gynecology   Contact information:   [address] [place] [address] [phone]       Newborn Data: Live born female  Birth Weight: 6 lb 3.5 oz (2820 g) APGAR: 8, 9  Home with mother.  Loney LaurenceMichelle A Winnie Barsky 07/02/2013, 7:45 AM

## 2013-07-02 NOTE — Progress Notes (Signed)
Patient is eating, ambulating, voiding.  Pain control is good.  Filed Vitals:   06/30/13 1758 06/30/13 2143 07/01/13 0600 07/01/13 1730  BP: 108/74 105/64 105/82 114/76  Pulse: 65 62 71 65  Temp: 98.2 F (36.8 C) 98 F (36.7 C) 97.9 F (36.6 C) 98.2 F (36.8 C)  TempSrc: Oral Oral Oral Oral  Resp: 18 16 18 18   Height:      Weight:      SpO2: 100%       Fundus firm Perineum without swelling.  Lab Results  Component Value Date   WBC 12.6* 07/01/2013   HGB 11.0* 07/01/2013   HCT 32.6* 07/01/2013   MCV 89.8 07/01/2013   PLT 228 07/01/2013    --/--/O NEG (04/20 0536)/RI  A/P Post partum day 2.  Routine care.  Expect d/c today.  Baby O+- pt received Rhogam.  Loney LaurenceMichelle A Drishti Pepperman

## 2013-07-02 NOTE — Lactation Note (Signed)
This note was copied from the chart of Joyce Mcclure. Lactation Consultation Note : Follow up visit with mom before DC. Mom reports that baby was sleepy though the night but fed for 1 hour about 1 1/2 hours ago. Reports that her breasts are feeling fuller this morning. Reports that baby is latching better No questions at present. Reviewed [facility]FS[facility] and OP appointments as resources for support after DC. TO call prn  Patient Name: Joyce Mcclure ZOXWR'UToday's Date: 07/02/2013 Reason for consult: Follow-up assessment   Maternal Data Formula Feeding for Exclusion: No Infant to breast within first hour of birth: Yes Has patient been taught Hand Expression?: Yes Does the patient have breastfeeding experience prior to this delivery?: Yes  Feeding Feeding Type: Breast Fed Length of feed: 60 min  LATCH Score/Interventions                      Lactation Tools Discussed/Used     Consult Status Consult Status: Complete    Pamelia HoitDonna D Tyese Finken 07/02/2013, 9:25 AM

## 2014-01-13 ENCOUNTER — Encounter (HOSPITAL_COMMUNITY): Payer: Self-pay | Admitting: *Deleted

## 2014-02-05 ENCOUNTER — Emergency Department (HOSPITAL_COMMUNITY)
Admission: EM | Admit: 2014-02-05 | Discharge: 2014-02-05 | Disposition: A | Discharge status: Home / Self Care | Payer: Medicaid Other | Attending: Emergency Medicine | Admitting: Emergency Medicine

## 2014-02-05 ENCOUNTER — Encounter (HOSPITAL_COMMUNITY): Payer: Self-pay | Admitting: *Deleted

## 2014-02-05 ENCOUNTER — Emergency Department (HOSPITAL_COMMUNITY): Payer: Medicaid Other

## 2014-02-05 DIAGNOSIS — M791 Myalgia: Secondary | ICD-10-CM | POA: Diagnosis not present

## 2014-02-05 DIAGNOSIS — R05 Cough: Secondary | ICD-10-CM | POA: Diagnosis present

## 2014-02-05 DIAGNOSIS — Z79899 Other long term (current) drug therapy: Secondary | ICD-10-CM | POA: Diagnosis not present

## 2014-02-05 DIAGNOSIS — R000 Tachycardia, unspecified: Secondary | ICD-10-CM | POA: Insufficient documentation

## 2014-02-05 DIAGNOSIS — R0789 Other chest pain: Secondary | ICD-10-CM

## 2014-02-05 DIAGNOSIS — J189 Pneumonia, unspecified organism: Secondary | ICD-10-CM

## 2014-02-05 DIAGNOSIS — Z720 Tobacco use: Secondary | ICD-10-CM | POA: Diagnosis not present

## 2014-02-05 DIAGNOSIS — J159 Unspecified bacterial pneumonia: Secondary | ICD-10-CM | POA: Diagnosis not present

## 2014-02-05 DIAGNOSIS — R51 Headache: Secondary | ICD-10-CM | POA: Diagnosis not present

## 2014-02-05 DIAGNOSIS — R059 Cough, unspecified: Secondary | ICD-10-CM

## 2014-02-05 DIAGNOSIS — R062 Wheezing: Secondary | ICD-10-CM

## 2014-02-05 MED ORDER — ALBUTEROL SULFATE HFA 108 (90 BASE) MCG/ACT IN AERS: 2.0000 | INHALATION_SPRAY | RESPIRATORY_TRACT | Status: DC | PRN

## 2014-02-05 MED ORDER — ALBUTEROL SULFATE (2.5 MG/3ML) 0.083% IN NEBU
5.0000 mg | INHALATION_SOLUTION | Freq: Once | RESPIRATORY_TRACT | Status: AC
  Administered 2014-02-05: 5 mg via RESPIRATORY_TRACT
  Filled 2014-02-05: qty 6

## 2014-02-05 MED ORDER — AZITHROMYCIN 250 MG PO TABS: 250.0000 mg | ORAL_TABLET | Freq: Every day | ORAL | Status: DC

## 2014-02-05 MED ORDER — ACETAMINOPHEN 325 MG PO TABS
650.0000 mg | ORAL_TABLET | Freq: Once | ORAL | Status: AC
  Administered 2014-02-05: 650 mg via ORAL
  Filled 2014-02-05: qty 2

## 2014-02-05 NOTE — ED Provider Notes (Signed)
CSN: [id number]     Arrival date & time 02/05/14  1951 History   First MD Initiated Contact with Patient 02/05/14 2004     Chief Complaint  Patient presents with  . Cough  . Generalized Body Aches     (Consider location/radiation/quality/duration/timing/severity/associated sxs/prior Treatment) The history is provided by the patient and medical records. No language interpreter was used.     Joyce Mcclure is a 33 y.o. female  with a hx of No major medical problems presents to the Emergency Department complaining of gradual, persistent, progressively worsening URI symptoms onset 3-4 days ago. Associated symptoms include sore throat, cough, chest tightness, chest pain with coughing, rhinorrhea, nasal congestion.  Patient reports her son brought his cold home from school and the entire house is sick now.  She reports she has not attempted to take any medications for this. No aggravating or alleviating factors. Pt denies fever, headache, neck pain, neck stiffness, shortness of breath, abdominal pain, nausea, vomiting, diarrhea, weakness, dizziness, syncope.  Patient reports she is an everyday smoker, smoking 7-10 cigarettes per day.   History reviewed. No pertinent past medical history. Past Surgical History  Procedure Laterality Date  . No past surgeries     No family history on file. History  Substance Use Topics  . Smoking status: Current Every Day Smoker -- 0.25 packs/day    Types: Cigarettes  . Smokeless tobacco: Never Used  . Alcohol Use: No   OB History    Gravida Para Term Preterm AB TAB SAB Ectopic Multiple Living   4 2 2  2 1 1   2      Review of Systems  Constitutional: Positive for fatigue. Negative for fever, chills and appetite change.  HENT: Positive for congestion, postnasal drip, rhinorrhea, sinus pressure and sore throat. Negative for ear discharge, ear pain and mouth sores.   Eyes: Negative for visual disturbance.  Respiratory: Positive for cough, chest tightness  and wheezing. Negative for shortness of breath and stridor.   Cardiovascular: Negative for chest pain, palpitations and leg swelling.  Gastrointestinal: Negative for nausea, vomiting, abdominal pain and diarrhea.  Genitourinary: Negative for dysuria, urgency, frequency and hematuria.  Musculoskeletal: Negative for myalgias, back pain, arthralgias and neck stiffness.  Skin: Negative for rash.  Neurological: Positive for headaches. Negative for syncope, light-headedness and numbness.  Hematological: Negative for adenopathy.  Psychiatric/Behavioral: The patient is not nervous/anxious.   All other systems reviewed and are negative.     Allergies  Review of patient's allergies indicates no known allergies.  Home Medications   Prior to Admission medications   Medication Sig Start Date End Date Taking? Authorizing Provider  docusate sodium (COLACE) 100 MG capsule Take 1 capsule (100 mg total) by mouth 2 (two) times daily as needed. 06/28/13  Yes Lisa A Leftwich-Kirby, CNM  lidocaine (LINDAMANTLE) 3 % CREA cream Apply 1 application topically as needed (hemorrhoids).   Yes Historical Provider, MD  Prenatal Vit-Fe Fumarate-FA (PRENATAL MULTIVITAMIN) TABS tablet Take 1 tablet by mouth daily at 12 noon.   Yes Historical Provider, MD  albuterol (PROVENTIL HFA;VENTOLIN HFA) 108 (90 BASE) MCG/ACT inhaler Inhale 2 puffs into the lungs every 4 (four) hours as needed for wheezing or shortness of breath. 02/05/14   Bellamie Turney, PA-C  azithromycin (ZITHROMAX) 250 MG tablet Take 1 tablet (250 mg total) by mouth daily. Take first 2 tablets together, then 1 every day until finished. 02/05/14   Naisha Wisdom, PA-C  hydrocortisone-pramoxine (PROCTOFOAM HC) rectal foam Place 1 applicator  rectally 2 (two) times daily. Patient not taking: Reported on 02/05/2014 07/02/13   Loney Laurence, MD   BP 122/78 mmHg  Pulse 108  Temp(Src) 99 F (37.2 C) (Oral)  Resp 36  Wt 127 lb 8 oz (57.834 kg)  SpO2  97%  LMP 01/18/2014  Breastfeeding? No Physical Exam  Constitutional: She is oriented to person, place, and time. She appears well-developed and well-nourished. No distress.  HENT:  Head: Normocephalic and atraumatic.  Right Ear: Tympanic membrane, external ear and ear canal normal.  Left Ear: Tympanic membrane, external ear and ear canal normal.  Nose: Mucosal edema and rhinorrhea present. No epistaxis. Right sinus exhibits no maxillary sinus tenderness and no frontal sinus tenderness. Left sinus exhibits no maxillary sinus tenderness and no frontal sinus tenderness.  Mouth/Throat: Uvula is midline and mucous membranes are normal. Mucous membranes are not pale and not cyanotic. Posterior oropharyngeal erythema present. No oropharyngeal exudate, posterior oropharyngeal edema or tonsillar abscesses.  Oropharynx erythematous without exudate or edema  Eyes: Conjunctivae are normal. Pupils are equal, round, and reactive to light.  Neck: Normal range of motion and full passive range of motion without pain.  Cardiovascular: Regular rhythm, normal heart sounds and intact distal pulses.   No murmur heard. Tachycardia  Pulmonary/Chest: Effort normal. No stridor. She has decreased breath sounds. She has wheezes.  Decreased breath sounds and scattered wheezes throughout.  Abdominal: Soft. Bowel sounds are normal. There is no tenderness.  Musculoskeletal: Normal range of motion.  Lymphadenopathy:    She has no cervical adenopathy.  Neurological: She is alert and oriented to person, place, and time.  Skin: Skin is warm and dry. No rash noted. She is not diaphoretic.  Psychiatric: She has a normal mood and affect.  Nursing note and vitals reviewed.   ED Course  Procedures (including critical care time) Labs Review Labs Reviewed - No data to display  Imaging Review Dg Chest 2 View  02/05/2014   CLINICAL DATA:  Chest pain and productive cough  EXAM: CHEST  2 VIEW  COMPARISON:  None.  FINDINGS:  There is focal infiltrate in the right middle lobe, better seen on the lateral view. Elsewhere lungs are clear. Heart size and pulmonary vascularity are normal. No adenopathy. No bone lesions.  IMPRESSION: Right middle lobe infiltrate.   Electronically Signed   By: Bretta Bang M.D.   On: 02/05/2014 21:22     EKG Interpretation None      MDM   Final diagnoses:  Cough  Wheezing  Chest tightness  CAP (community acquired pneumonia)   Joyce Mcclure presents with URI symptoms.  Mild, scattered wheezes throughout and decreased breath sounds. Will give albuterol treatment and obtain chest x-ray.  9:59 PM Patient lung sounds improved after albuterol. She reports decrease in her chest tightness and coughing. Patient now without wheezing.    Patient has been diagnosed with CAP via chest xray. Patient without hypoxia. Pt is not ill appearing, immunocompromised, and does not have multiple co morbidities, therefore I feel like the they can be treated as an OP with abx therapy. Pt has been advised to return to the ED if symptoms worsen or they do not improve. Pt verbalizes understanding and is agreeable with plan.    I have personally reviewed patient's vitals, nursing note and any pertinent labs or imaging.  I performed an focused physical exam; undressed when appropriate .    It has been determined that no acute conditions requiring further emergency intervention are  present at this time. The patient/guardian have been advised of the diagnosis and plan. I reviewed any labs and imaging including any potential incidental findings. We have discussed signs and symptoms that warrant return to the ED and they are listed in the discharge instructions.    Vital signs are stable at discharge.   BP 122/78 mmHg  Pulse 108  Temp(Src) 99 F (37.2 C) (Oral)  Resp 36  Wt 127 lb 8 oz (57.834 kg)  SpO2 97%  LMP 01/18/2014  Breastfeeding? No        Dierdre ForthHannah Frankee Gritz, PA-C 02/05/14  2201  Tilden FossaElizabeth Rees, MD 02/05/14 [phone]

## 2014-02-05 NOTE — ED Notes (Signed)
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  

## 2014-02-05 NOTE — Discharge Instructions (Signed)
1. Medications: Albuterol, azithromycin, usual home medications 2. Treatment: rest, drink plenty of fluids, take tylenol or ibuprofen for fever control 3. Follow Up: Please followup with your primary doctor in 3-5 days for discussion of your diagnoses and further evaluation after today's visit; if you do not have a primary care doctor use the resource guide provided to find one; Return to the ER for high fevers, difficulty breathing or other concerning symptoms   Pneumonia Pneumonia is an infection of the lungs.  CAUSES Pneumonia may be caused by bacteria or a virus. Usually, these infections are caused by breathing infectious particles into the lungs (respiratory tract). SIGNS AND SYMPTOMS   Cough.  Fever.  Chest pain.  Increased rate of breathing.  Wheezing.  Mucus production. DIAGNOSIS  If you have the common symptoms of pneumonia, your health care provider will typically confirm the diagnosis with a chest X-ray. The X-ray will show an abnormality in the lung (pulmonary infiltrate) if you have pneumonia. Other tests of your blood, urine, or sputum may be done to find the specific cause of your pneumonia. Your health care provider may also do tests (blood gases or pulse oximetry) to see how well your lungs are working. TREATMENT  Some forms of pneumonia may be spread to other people when you cough or sneeze. You may be asked to wear a mask before and during your exam. Pneumonia that is caused by bacteria is treated with antibiotic medicine. Pneumonia that is caused by the influenza virus may be treated with an antiviral medicine. Most other viral infections must run their course. These infections will not respond to antibiotics.  HOME CARE INSTRUCTIONS   Cough suppressants may be used if you are losing too much rest. However, coughing protects you by clearing your lungs. You should avoid using cough suppressants if you can.  Your health care provider may have prescribed medicine if he  or she thinks your pneumonia is caused by bacteria or influenza. Finish your medicine even if you start to feel better.  Your health care provider may also prescribe an expectorant. This loosens the mucus to be coughed up.  Take medicines only as directed by your health care provider.  Do not smoke. Smoking is a common cause of bronchitis and can contribute to pneumonia. If you are a smoker and continue to smoke, your cough may last several weeks after your pneumonia has cleared.  A cold steam vaporizer or humidifier in your room or home may help loosen mucus.  Coughing is often worse at night. Sleeping in a semi-upright position in a recliner or using a couple pillows under your head will help with this.  Get rest as you feel it is needed. Your body will usually let you know when you need to rest. PREVENTION A pneumococcal shot (vaccine) is available to prevent a common bacterial cause of pneumonia. This is usually suggested for:  People over 31 years old.  Patients on chemotherapy.  People with chronic lung problems, such as bronchitis or emphysema.  People with immune system problems. If you are over 65 or have a high risk condition, you may receive the pneumococcal vaccine if you have not received it before. In some countries, a routine influenza vaccine is also recommended. This vaccine can help prevent some cases of pneumonia.You may be offered the influenza vaccine as part of your care. If you smoke, it is time to quit. You may receive instructions on how to stop smoking. Your health care provider can provide  medicines and counseling to help you quit. SEEK MEDICAL CARE IF: You have a fever. SEEK IMMEDIATE MEDICAL CARE IF:   Your illness becomes worse. This is especially true if you are elderly or weakened from any other disease.  You cannot control your cough with suppressants and are losing sleep.  You begin coughing up blood.  You develop pain which is getting worse or is  uncontrolled with medicines.  Any of the symptoms which initially brought you in for treatment are getting worse rather than better.  You develop shortness of breath or chest pain. MAKE SURE YOU:   Understand these instructions.  Will watch your condition.  Will get help right away if you are not doing well or get worse. Document Released: 02/28/2005 Document Revised: 07/15/2013 Document Reviewed: 05/20/2010 [facility] Patient Information 2015 [place], [place]. This information is not intended to replace advice given to you by your health care provider. Make sure you discuss any questions you have with your health care provider.

## 2014-02-05 NOTE — ED Notes (Signed)
Pt reports cough, chills, generalized body aches, sore throat x 4 days.

## 2014-04-09 ENCOUNTER — Encounter (HOSPITAL_COMMUNITY): Payer: Self-pay | Admitting: Emergency Medicine

## 2014-04-09 ENCOUNTER — Emergency Department (HOSPITAL_COMMUNITY)
Admission: EM | Admit: 2014-04-09 | Discharge: 2014-04-09 | Disposition: A | Discharge status: Home / Self Care | Payer: Medicaid Other | Attending: Emergency Medicine | Admitting: Emergency Medicine

## 2014-04-09 DIAGNOSIS — R5383 Other fatigue: Secondary | ICD-10-CM | POA: Diagnosis not present

## 2014-04-09 DIAGNOSIS — H9209 Otalgia, unspecified ear: Secondary | ICD-10-CM | POA: Diagnosis not present

## 2014-04-09 DIAGNOSIS — Z720 Tobacco use: Secondary | ICD-10-CM | POA: Diagnosis not present

## 2014-04-09 DIAGNOSIS — Z79899 Other long term (current) drug therapy: Secondary | ICD-10-CM | POA: Diagnosis not present

## 2014-04-09 DIAGNOSIS — J029 Acute pharyngitis, unspecified: Secondary | ICD-10-CM | POA: Insufficient documentation

## 2014-04-09 LAB — RAPID STREP SCREEN (MED CTR MEBANE ONLY): STREPTOCOCCUS, GROUP A SCREEN (DIRECT): NEGATIVE

## 2014-04-09 MED ORDER — NAPROXEN 500 MG PO TABS: 500.0000 mg | ORAL_TABLET | Freq: Two times a day (BID) | ORAL | Status: DC

## 2014-04-09 MED ORDER — DEXAMETHASONE SODIUM PHOSPHATE 10 MG/ML IJ SOLN
10.0000 mg | Freq: Once | INTRAMUSCULAR | Status: AC
  Administered 2014-04-09: 10 mg via INTRAMUSCULAR
  Filled 2014-04-09: qty 1

## 2014-04-09 MED ORDER — HYDROCODONE-ACETAMINOPHEN 7.5-325 MG/15ML PO SOLN: 15.0000 mL | Freq: Four times a day (QID) | ORAL | Status: DC | PRN

## 2014-04-09 NOTE — Discharge Instructions (Signed)
Your rapid strep screen today was negative. Your symptoms are likely because of a viral illness. Recommend naproxen as prescribed for pain and swelling. You may use over-the-counter Chloraseptic spray for persistent sore throat. Also recommend the use of warm teas with honey and salt water gargles 3-4 times per day. You may use Hycet as prescribed, as needed, for persistent sore throat. Do not use this medication before driving a motor vehicle or operating heavy machinery. Follow up with a primary care doctor for a recheck of symptoms. Return as needed if symptoms worsen.  Pharyngitis Pharyngitis is redness, pain, and swelling (inflammation) of your pharynx.  CAUSES  Pharyngitis is usually caused by infection. Most of the time, these infections are from viruses (viral) and are part of a cold. However, sometimes pharyngitis is caused by bacteria (bacterial). Pharyngitis can also be caused by allergies. Viral pharyngitis may be spread from person to person by coughing, sneezing, and personal items or utensils (cups, forks, spoons, toothbrushes). Bacterial pharyngitis may be spread from person to person by more intimate contact, such as kissing.  SIGNS AND SYMPTOMS  Symptoms of pharyngitis include:   Sore throat.   Tiredness (fatigue).   Low-grade fever.   Headache.  Joint pain and muscle aches.  Skin rashes.  Swollen lymph nodes.  Plaque-like film on throat or tonsils (often seen with bacterial pharyngitis). DIAGNOSIS  Your health care provider will ask you questions about your illness and your symptoms. Your medical history, along with a physical exam, is often all that is needed to diagnose pharyngitis. Sometimes, a rapid strep test is done. Other lab tests may also be done, depending on the suspected cause.  TREATMENT  Viral pharyngitis will usually get better in 3-4 days without the use of medicine. Bacterial pharyngitis is treated with medicines that kill germs (antibiotics).  HOME  CARE INSTRUCTIONS   Drink enough water and fluids to keep your urine clear or pale yellow.   Only take over-the-counter or prescription medicines as directed by your health care provider:   If you are prescribed antibiotics, make sure you finish them even if you start to feel better.   Do not take aspirin.   Get lots of rest.   Gargle with 8 oz of salt water ( tsp of salt per 1 qt of water) as often as every 1-2 hours to soothe your throat.   Throat lozenges (if you are not at risk for choking) or sprays may be used to soothe your throat. SEEK MEDICAL CARE IF:   You have large, tender lumps in your neck.  You have a rash.  You cough up green, yellow-brown, or bloody spit. SEEK IMMEDIATE MEDICAL CARE IF:   Your neck becomes stiff.  You drool or are unable to swallow liquids.  You vomit or are unable to keep medicines or liquids down.  You have severe pain that does not go away with the use of recommended medicines.  You have trouble breathing (not caused by a stuffy nose). MAKE SURE YOU:   Understand these instructions.  Will watch your condition.  Will get help right away if you are not doing well or get worse. Document Released: 02/28/2005 Document Revised: 12/19/2012 Document Reviewed: 11/05/2012 [facility] CenterExitCare Patient Information 2015 [place], [place]. This information is not intended to replace advice given to you by your health care provider. Make sure you discuss any questions you have with your health care provider.

## 2014-04-09 NOTE — ED Provider Notes (Signed)
CSN: [id number]     Arrival date & time 04/09/14  0455 History   First MD Initiated Contact with Patient 04/09/14 0503     Chief Complaint  Patient presents with  . Sore Throat     (Consider location/radiation/quality/duration/timing/severity/associated sxs/prior Treatment) Patient is a 34 y.o. female presenting with pharyngitis. The history is provided by the patient. No language interpreter was used.  Sore Throat This is a new problem. Episode onset: 2 days ago. The problem occurs constantly. The problem has been gradually worsening. Associated symptoms include fatigue and a sore throat. Pertinent negatives include no congestion, coughing, fever, nausea, numbness or vomiting. The symptoms are aggravated by swallowing. She has tried acetaminophen for the symptoms. The treatment provided no relief.    History reviewed. No pertinent past medical history. Past Surgical History  Procedure Laterality Date  . No past surgeries     History reviewed. No pertinent family history. History  Substance Use Topics  . Smoking status: Current Every Day Smoker -- 0.25 packs/day    Types: Cigarettes  . Smokeless tobacco: Never Used  . Alcohol Use: No   OB History    Gravida Para Term Preterm AB TAB SAB Ectopic Multiple Living   Review of Systems  Constitutional: Positive for fatigue. Negative for fever.  HENT: Positive for ear pain and sore throat. Negative for congestion and ear discharge.   Respiratory: Negative for cough.   Gastrointestinal: Negative for nausea and vomiting.  Musculoskeletal: Negative for neck stiffness.  Neurological: Negative for numbness.  All other systems reviewed and are negative.   Allergies  Review of patient's allergies indicates no known allergies.  Home Medications   Prior to Admission medications   Medication Sig Start Date End Date Taking? Authorizing Provider  albuterol (PROVENTIL HFA;VENTOLIN HFA) 108 (90 BASE) MCG/ACT inhaler  Inhale 2 puffs into the lungs every 4 (four) hours as needed for wheezing or shortness of breath. 02/05/14   Hannah Muthersbaugh, PA-C  azithromycin (ZITHROMAX) 250 MG tablet Take 1 tablet (250 mg total) by mouth daily. Take first 2 tablets together, then 1 every day until finished. 02/05/14   Hannah Muthersbaugh, PA-C  docusate sodium (COLACE) 100 MG capsule Take 1 capsule (100 mg total) by mouth 2 (two) times daily as needed. 06/28/13   Wilmer Floor Leftwich-Kirby, CNM  HYDROcodone-acetaminophen (HYCET) 7.5-325 mg/15 ml solution Take 15 mLs by mouth every 6 (six) hours as needed for moderate pain or severe pain (For sore throat). 04/09/14   Antony Madura, PA-C  hydrocortisone-pramoxine (PROCTOFOAM [facility]) rectal foam Place 1 applicator rectally 2 (two) times daily. Patient not taking: Reported on 02/05/2014 07/02/13   Loney Laurence, MD  lidocaine (LINDAMANTLE) 3 % CREA cream Apply 1 application topically as needed (hemorrhoids).    Historical Provider, MD  naproxen (NAPROSYN) 500 MG tablet Take 1 tablet (500 mg total) by mouth 2 (two) times daily. 04/09/14   Antony Madura, PA-C  Prenatal Vit-Fe Fumarate-FA (PRENATAL MULTIVITAMIN) TABS tablet Take 1 tablet by mouth daily at 12 noon.    Historical Provider, MD   BP 107/70 mmHg  Pulse 70  Temp(Src) 97.8 F (36.6 C) (Oral)  Resp 17  Ht  (1.651 m)  Wt 125 lb (56.7 kg)  BMI 20.80 kg/m2  SpO2 98%   Physical Exam  Constitutional: She is oriented to person, place, and time. She appears well-developed and well-nourished. No distress.  Nontoxic/nonseptic appearing  HENT:  Head: Normocephalic and atraumatic.  Right Ear: Tympanic membrane, external ear and ear canal normal.  Left Ear: Tympanic membrane, external ear and ear canal normal.  Nose: Nose normal.  Mouth/Throat: Uvula is midline and mucous membranes are normal. No oral lesions. No trismus in the jaw. No uvula swelling. Posterior oropharyngeal erythema present. No oropharyngeal exudate or posterior  oropharyngeal edema.  Mild posterior oropharyngeal erythema without exudates. No tonsillar enlargement. Uvula midline. Patient tolerating secretions without difficulty.  Eyes: Conjunctivae and EOM are normal. No scleral icterus.  Neck: Normal range of motion. Neck supple.  No nuchal rigidity or meningismus. Mild tender anterior cervical lymphadenopathy on the left.  Pulmonary/Chest: Effort normal. No respiratory distress.  Respirations even and unlabored  Musculoskeletal: Normal range of motion.  Lymphadenopathy:    She has cervical adenopathy.  Neurological: She is alert and oriented to person, place, and time. She exhibits normal muscle tone. Coordination normal.  GCS 15. Speech is goal oriented. No voice changes noted. Patient moving all extremities.  Skin: Skin is warm and dry. No rash noted. She is not diaphoretic. No erythema. No pallor.  Psychiatric: She has a normal mood and affect. Her behavior is normal.  Nursing note and vitals reviewed.   ED Course  Procedures (including critical care time) Labs Review Labs Reviewed  RAPID STREP SCREEN  CULTURE, GROUP A STREP    Imaging Review No results found.   EKG Interpretation None      MDM   Final diagnoses:  Viral pharyngitis    Pt afebrile without tonsillar exudate, negative strep. Presents with mild cervical lymphadenopathy and dysphagia; diagnosis of viral pharyngitis. No abx indicated. Will discharge with symptomatic tx for pain. Pt does not appear dehydrated, but did discuss importance of water rehydration. Presentation not concerning for PTA or infxn spread to soft tissue. No trismus or uvula deviation. Specific return precautions discussed. Pt eating secretions in ED without difficulty. She is stable for discharge at this time with instruction of follow-up with her primary care provider. Patient agreeable to plan with no unaddressed concerns.   Filed Vitals:   04/09/14 0509 04/09/14 0513 04/09/14 0515 04/09/14 0530   BP: 106/78  99/76 107/70  Pulse: 73  80 70  Temp: 97.8 F (36.6 C)     TempSrc: Oral     Resp: 17     Height: 5\' 5"  (1.651 m)     Weight: 125 lb (56.7 kg)     SpO2: 99% 99% 99% 98%       Antony MaduraKelly Diallo Ponder, PA-C 04/09/14 [id number]  Lyanne CoKevin M Campos, MD 04/09/14 [phone]

## 2014-04-09 NOTE — ED Notes (Signed)
Pt a/o x 4 on d/c with steady gait. 

## 2014-04-09 NOTE — ED Notes (Signed)
Pt c/o sore throat x 2 days with pain in throat worsening rapidly.

## 2014-04-11 LAB — CULTURE, GROUP A STREP

## 2016-05-16 IMAGING — DX DG CHEST 2V
2 series · 2 of 2 positions shown · non-contrast
Comparison: None.

CLINICAL DATA: Chest pain and productive cough

EXAM:
CHEST  2 VIEW

[chest pa]
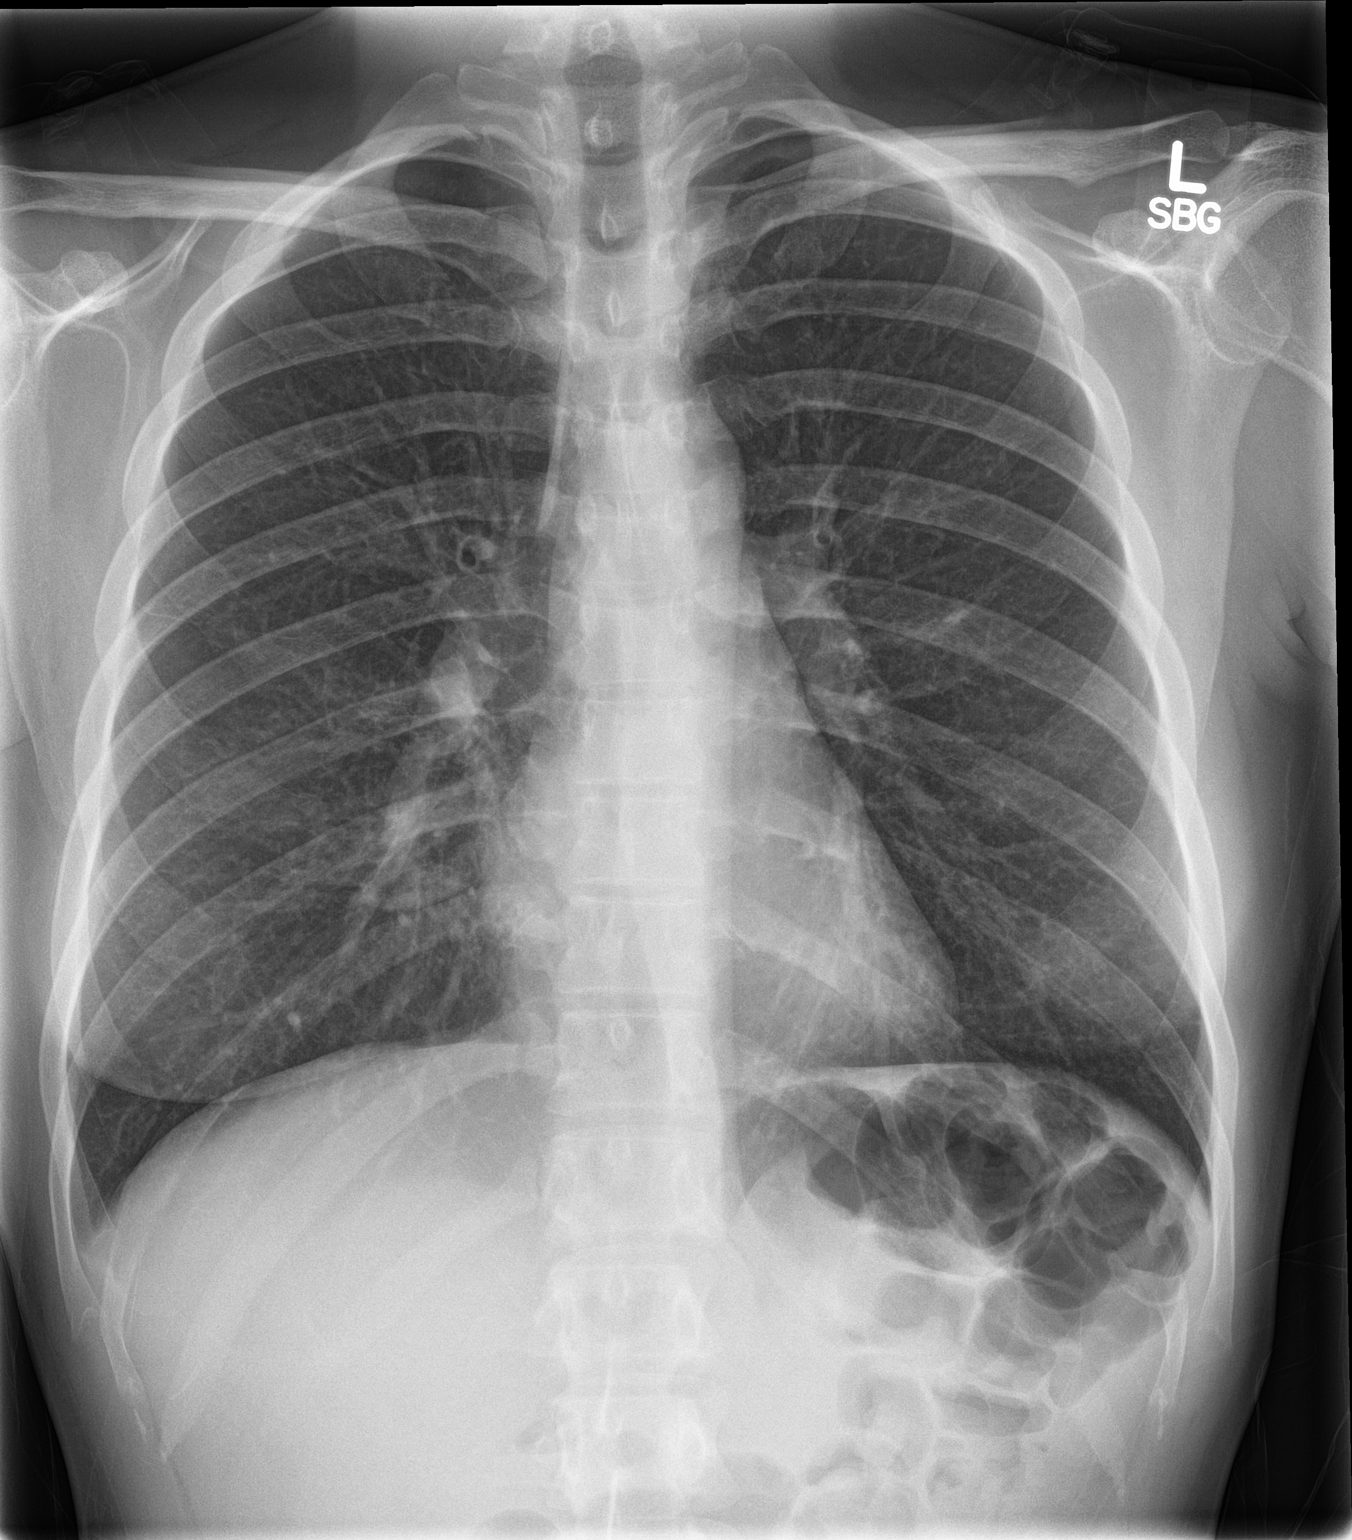

[chest lat]
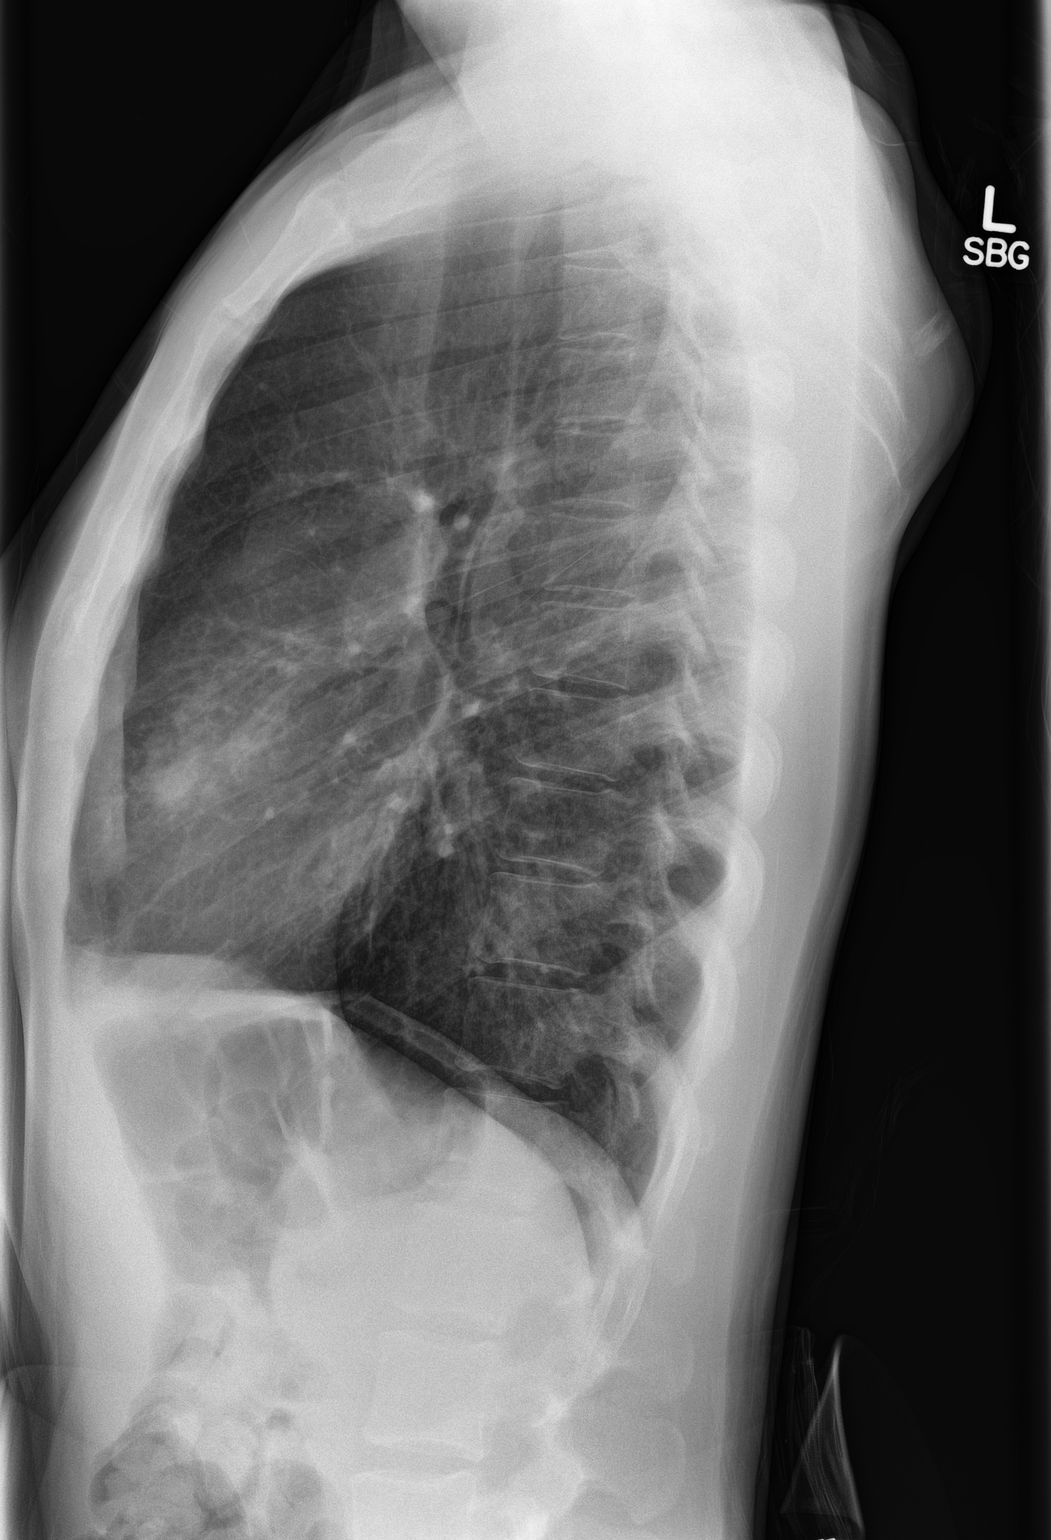

[2 of 2 positions shown; findings below may reference images not displayed]

FINDINGS: There is focal infiltrate in the right middle lobe, better seen on
the lateral view. Elsewhere lungs are clear. Heart size and
pulmonary vascularity are normal. No adenopathy. No bone lesions.
IMPRESSION: Right middle lobe infiltrate.

## 2020-08-03 ENCOUNTER — Encounter (HOSPITAL_COMMUNITY): Payer: Self-pay

## 2020-08-03 ENCOUNTER — Ambulatory Visit (HOSPITAL_COMMUNITY)
Admission: EM | Admit: 2020-08-03 | Discharge: 2020-08-03 | Disposition: A | Discharge status: Home / Self Care | Payer: Self-pay

## 2020-08-03 DIAGNOSIS — J302 Other seasonal allergic rhinitis: Secondary | ICD-10-CM

## 2020-08-03 NOTE — ED Provider Notes (Signed)
[facility]    CSN: [id number] Arrival date & time: 08/03/20  1256      History   Chief Complaint Chief Complaint  Patient presents with  . Headache  . Nasal Congestion    HPI Joyce Mcclure is a 40 y.o. female.   40 year old female presents to urgent care with 1 day history of nasal congestion, runny, watery eyes, headache.  Patient states she took Tylenol without any relief.  No known illness contact  The history is provided by the patient. No language interpreter was used.    History reviewed. No pertinent past medical history.  Patient Active Problem List   Diagnosis Date Noted  . Seasonal allergies 08/03/2020  . Normal labor 06/30/2013  . Status post normal vaginal delivery 06/30/2013    Past Surgical History:  Procedure Laterality Date  . NO PAST SURGERIES      OB History    Gravida  4   Para  2   Term  2   Preterm      AB  2   Living  2     SAB  1   IAB  1   Ectopic      Multiple      Live Births  2            Home Medications    Prior to Admission medications   Medication Sig Start Date End Date Taking? Authorizing Provider  albuterol (PROVENTIL HFA;VENTOLIN HFA) 108 (90 BASE) MCG/ACT inhaler Inhale 2 puffs into the lungs every 4 (four) hours as needed for wheezing or shortness of breath. 02/05/14   Muthersbaugh, Dahlia Client, PA-C  azithromycin (ZITHROMAX) 250 MG tablet Take 1 tablet (250 mg total) by mouth daily. Take first 2 tablets together, then 1 every day until finished. 02/05/14   Muthersbaugh, Dahlia Client, PA-C  docusate sodium (COLACE) 100 MG capsule Take 1 capsule (100 mg total) by mouth 2 (two) times daily as needed. 06/28/13   Leftwich-Kirby, Wilmer Floor, CNM  HYDROcodone-acetaminophen (HYCET) 7.5-325 mg/15 ml solution Take 15 mLs by mouth every 6 (six) hours as needed for moderate pain or severe pain (For sore throat). 04/09/14   Antony Madura, PA-C  hydrocortisone-pramoxine (PROCTOFOAM HC) rectal foam Place 1 applicator  rectally 2 (two) times daily. Patient not taking: No sig reported 07/02/13   Carrington Clamp, MD  lidocaine [facility]) 3 % CREA cream Apply 1 application topically as needed (hemorrhoids).    [provider]  naproxen (NAPROSYN) 500 MG tablet Take 1 tablet (500 mg total) by mouth 2 (two) times daily. 04/09/14   Antony Madura, PA-C  Prenatal Vit-Fe Fumarate-FA (PRENATAL MULTIVITAMIN) TABS tablet Take 1 tablet by mouth daily at 12 noon.    [provider]    Family History History reviewed. No pertinent family history.  Social History Social History   Tobacco Use  . Smoking status: Current Every Day Smoker    Packs/day: 0.25    Types: Cigarettes  . Smokeless tobacco: Never Used  Substance Use Topics  . Alcohol use: No  . Drug use: No     Allergies   Patient has no known allergies.   Review of Systems Review of Systems  Constitutional: Negative for fever.  HENT: Positive for congestion, rhinorrhea, sinus pressure and sinus pain.   Respiratory: Positive for cough.   Allergic/Immunologic: Positive for environmental allergies.  All other systems reviewed and are negative.    Physical Exam Triage Vital Signs ED Triage Vitals  Enc Vitals Group  BP 08/03/20 1359 117/69     Pulse --      Resp 08/03/20 1359 18     Temp 08/03/20 1359 98.4 F (36.9 C)     Temp Source 08/03/20 1359 Oral     SpO2 08/03/20 1359 100 %     Weight --      Height --      Head Circumference --      Peak Flow --      Pain Score 08/03/20 1357 5     Pain Loc --      Pain Edu? --      Excl. in GC? --    No data found.  Updated Vital Signs BP 117/69 (BP Location: Right Arm)   Temp 98.4 F (36.9 C) (Oral)   Resp 18   LMP  (Within Weeks) Comment: 3 weeks  SpO2 100%   Visual Acuity Right Eye Distance:   Left Eye Distance:   Bilateral Distance:    Right Eye Near:   Left Eye Near:    Bilateral Near:     Physical Exam Vitals and nursing note reviewed.   Constitutional:      General: She is not in acute distress.    Appearance: She is well-developed.  HENT:     Head: Normocephalic.     Right Ear: Tympanic membrane is retracted.     Left Ear: Tympanic membrane is retracted.     Nose: Congestion and rhinorrhea present.  Eyes:     General: Lids are normal. Vision grossly intact.     Extraocular Movements: Extraocular movements intact.     Conjunctiva/sclera: Conjunctivae normal.     Pupils: Pupils are equal, round, and reactive to light.  Neck:     Trachea: No tracheal deviation.  Cardiovascular:     Rate and Rhythm: Regular rhythm.     Pulses: Normal pulses.     Heart sounds: Normal heart sounds. No murmur heard.   Pulmonary:     Effort: Pulmonary effort is normal.     Breath sounds: Normal breath sounds.  Abdominal:     General: Bowel sounds are normal.     Palpations: Abdomen is soft.     Tenderness: There is no abdominal tenderness.  Musculoskeletal:        General: Normal range of motion.     Cervical back: Normal range of motion.  Lymphadenopathy:     Cervical: No cervical adenopathy.  Skin:    General: Skin is warm and dry.     Capillary Refill: Capillary refill takes less than 2 seconds.     Findings: No rash.  Neurological:     Mental Status: She is alert and oriented to person, place, and time.     GCS: GCS eye subscore is 4. GCS verbal subscore is 5. GCS motor subscore is 6.  Psychiatric:        Attention and Perception: Attention normal.        Mood and Affect: Mood normal.        Speech: Speech normal.        Behavior: Behavior normal. Behavior is cooperative.      UC Treatments / Results  Labs (all labs ordered are listed, but only abnormal results are displayed) Labs Reviewed - No data to display  EKG   Radiology No results found.  Procedures Procedures (including critical care time)  Medications Ordered in UC Medications - No data to display  Initial Impression / Assessment and Plan /  UC Course  I have reviewed the triage vital signs and the nursing notes.  Pertinent labs & imaging results that were available during my care of the patient were reviewed by me and considered in my medical decision making (see chart for details).     DDX: Seasonal allergies, COVID, viral illness Final Clinical Impressions(s) / UC Diagnoses   Final diagnoses:  Seasonal allergies     Discharge Instructions     Treat your symptoms with over-the-counter meds: Tylenol, ibuprofen, Flonase, allergy med of your choice(Zyrtec Claritin Allegra).  Drink plenty of fluids follow-up with your PCP.    ED Prescriptions    None     PDMP not reviewed this encounter.   Clancy Gourd, NP 08/03/20 1750

## 2020-08-03 NOTE — Discharge Instructions (Signed)
Treat your symptoms with over-the-counter meds: Tylenol, ibuprofen, Flonase, allergy med of your choice(Zyrtec Claritin Allegra).  Drink plenty of fluids follow-up with your PCP.

## 2020-08-03 NOTE — ED Triage Notes (Signed)
Pt reports head pressure, sinus pressure and nasal congestion x 1 day. Tylenol gives no relief.

## 2021-12-14 ENCOUNTER — Emergency Department (HOSPITAL_BASED_OUTPATIENT_CLINIC_OR_DEPARTMENT_OTHER): Payer: Medicaid Other

## 2021-12-14 ENCOUNTER — Other Ambulatory Visit: Payer: Self-pay

## 2021-12-14 ENCOUNTER — Encounter (HOSPITAL_BASED_OUTPATIENT_CLINIC_OR_DEPARTMENT_OTHER): Payer: Self-pay

## 2021-12-14 ENCOUNTER — Emergency Department (HOSPITAL_BASED_OUTPATIENT_CLINIC_OR_DEPARTMENT_OTHER)
Admission: EM | Admit: 2021-12-14 | Discharge: 2021-12-14 | Disposition: A | Discharge status: Home / Self Care | Payer: Medicaid Other | Attending: Emergency Medicine | Admitting: Emergency Medicine

## 2021-12-14 DIAGNOSIS — R519 Headache, unspecified: Secondary | ICD-10-CM | POA: Diagnosis not present

## 2021-12-14 NOTE — ED Provider Notes (Signed)
[facility] Provider Note   CSN: [id number] Arrival date & time: 12/14/21  9379     History  Chief Complaint  Patient presents with   Headache    Joyce Mcclure is a 41 y.o. female.  Pt complains of a headache.  Pt reports she began having headaches after being exposed to a lud m=noise for several months.  Pt reports headaches respond to tylenol and ibuprofen.  Pt reports pain starts in the back of her scalp and radiates to the front.  Pt denies any injuried to her head. Pt is worried that she has brain cancer.  Pt complains of muscle twitching on and off.    The history is provided by the patient. No language interpreter was used.  Headache Pain location:  Occipital Radiates to:  Does not radiate Timing:  Constant Progression:  Worsening Chronicity:  New Context: not coughing   Relieved by:  Nothing Worsened by:  Nothing Ineffective treatments:  None tried Associated symptoms: cough   Associated symptoms: no abdominal pain, no blurred vision, no congestion, no focal weakness, no numbness, no visual change, no vomiting and no weakness        Home Medications Prior to Admission medications   Medication Sig Start Date End Date Taking? Authorizing Provider  albuterol (PROVENTIL HFA;VENTOLIN HFA) 108 (90 BASE) MCG/ACT inhaler Inhale 2 puffs into the lungs every 4 (four) hours as needed for wheezing or shortness of breath. 02/05/14   Muthersbaugh, Jarrett Soho, PA-C  azithromycin (ZITHROMAX) 250 MG tablet Take 1 tablet (250 mg total) by mouth daily. Take first 2 tablets together, then 1 every day until finished. 02/05/14   Muthersbaugh, Jarrett Soho, PA-C  docusate sodium (COLACE) 100 MG capsule Take 1 capsule (100 mg total) by mouth 2 (two) times daily as needed. 06/28/13   Leftwich-Kirby, Kathie Dike, CNM  HYDROcodone-acetaminophen (HYCET) 7.5-325 mg/15 ml solution Take 15 mLs by mouth every 6 (six) hours as needed for moderate pain or severe pain (For sore throat).  04/09/14   Antonietta Breach, PA-C  hydrocortisone-pramoxine (PROCTOFOAM HC) rectal foam Place 1 applicator rectally 2 (two) times daily. Patient not taking: No sig reported 07/02/13   Bobbye Charleston, MD  lidocaine [facility]) 3 % CREA cream Apply 1 application topically as needed (hemorrhoids).    [provider]  naproxen (NAPROSYN) 500 MG tablet Take 1 tablet (500 mg total) by mouth 2 (two) times daily. 04/09/14   Antonietta Breach, PA-C  Prenatal Vit-Fe Fumarate-FA (PRENATAL MULTIVITAMIN) TABS tablet Take 1 tablet by mouth daily at 12 noon.    [provider]      Allergies    Patient has no known allergies.    Review of Systems   Review of Systems  HENT:  Negative for congestion.   Eyes:  Negative for blurred vision.  Respiratory:  Positive for cough.   Gastrointestinal:  Negative for abdominal pain and vomiting.  Neurological:  Positive for headaches. Negative for focal weakness, weakness and numbness.  All other systems reviewed and are negative.   Physical Exam Updated Vital Signs BP 103/69   Pulse 64   Temp 97.8 F (36.6 C) (Oral)   Resp 16   Ht 5\' 5"  (1.651 m)   Wt 52.2 kg   LMP 12/07/2021   SpO2 98%   BMI 19.14 kg/m  Physical Exam Vitals and nursing note reviewed.  Constitutional:      Appearance: She is well-developed.  HENT:     Head: Normocephalic.  Mouth/Throat:     Mouth: Mucous membranes are moist.  Eyes:     Extraocular Movements: Extraocular movements intact.     Pupils: Pupils are equal, round, and reactive to light.  Cardiovascular:     Rate and Rhythm: Normal rate.  Pulmonary:     Effort: Pulmonary effort is normal.  Abdominal:     General: There is no distension.  Musculoskeletal:        General: Normal range of motion.     Cervical back: Normal range of motion.  Skin:    General: Skin is warm.  Neurological:     Mental Status: She is alert and oriented to person, place, and time.     ED Results / Procedures / Treatments    Labs (all labs ordered are listed, but only abnormal results are displayed) Labs Reviewed - No data to display  EKG None  Radiology CT Head Wo Contrast  Result Date: 12/14/2021 CLINICAL DATA:  Headache on and off for 2 days EXAM: CT HEAD WITHOUT CONTRAST TECHNIQUE: Contiguous axial images were obtained from the base of the skull through the vertex without intravenous contrast. RADIATION DOSE REDUCTION: This exam was performed according to the departmental dose-optimization program which includes automated exposure control, adjustment of the mA and/or kV according to patient size and/or use of iterative reconstruction technique. COMPARISON:  None Available. FINDINGS: Brain: There is no acute intracranial hemorrhage, extra-axial fluid collection, or acute infarct. Parenchymal volume is normal. The ventricles are normal in size. Gray-white differentiation is preserved. There is no mass lesion.  There is no mass effect or midline shift. Vascular: No hyperdense vessel or unexpected calcification. Skull: Normal. Negative for fracture or focal lesion. Sinuses/Orbits: Imaged paranasal sinuses are clear. The globes and orbits are unremarkable. Other: None. IMPRESSION: Normal head CT. Electronically Signed   By: Lesia Hausen M.D.   On: 12/14/2021 10:33    Procedures Procedures    Medications Ordered in ED Medications - No data to display  ED Course/ Medical Decision Making/ A&P                           Medical Decision Making Pt complains of headaches for the last 6 months.    Amount and/or Complexity of Data Reviewed Radiology: ordered and independent interpretation performed. Decision-making details documented in ED Course.    Details: Ct head.  No acute abnormaltiy    Risk OTC drugs. Risk Details: Pt has relief with Otc medications.  I advised continue medications.  Pt advised to follow up with her primary care Physicain            Final Clinical Impression(s) / ED  Diagnoses Final diagnoses:  Nonintractable headache, unspecified chronicity pattern, unspecified headache type    Rx / DC Orders ED Discharge Orders     None      An After Visit Summary was printed and given to the patient.    Elson Areas, [place] 12/14/21 1056    Terrilee Files, MD 12/14/21 [phone]

## 2021-12-14 NOTE — Discharge Instructions (Signed)
Follow up with your Physician for recheck  

## 2021-12-14 NOTE — ED Triage Notes (Signed)
Pt states headache off and on x 2 days.  Pt states headache is mostly on the top of her head.

## 2022-10-27 ENCOUNTER — Encounter (HOSPITAL_COMMUNITY): Payer: Self-pay

## 2022-10-27 ENCOUNTER — Other Ambulatory Visit: Payer: Self-pay

## 2022-10-27 ENCOUNTER — Emergency Department (HOSPITAL_COMMUNITY)
Admission: EM | Admit: 2022-10-27 | Discharge: 2022-10-28 | Disposition: A | Discharge status: Home / Self Care | Payer: Medicaid Other | Attending: Emergency Medicine | Admitting: Emergency Medicine

## 2022-10-27 ENCOUNTER — Emergency Department (HOSPITAL_COMMUNITY): Payer: Medicaid Other

## 2022-10-27 DIAGNOSIS — G8929 Other chronic pain: Secondary | ICD-10-CM | POA: Insufficient documentation

## 2022-10-27 DIAGNOSIS — F419 Anxiety disorder, unspecified: Secondary | ICD-10-CM | POA: Diagnosis not present

## 2022-10-27 DIAGNOSIS — R1110 Vomiting, unspecified: Secondary | ICD-10-CM | POA: Insufficient documentation

## 2022-10-27 DIAGNOSIS — R519 Headache, unspecified: Secondary | ICD-10-CM | POA: Diagnosis not present

## 2022-10-27 DIAGNOSIS — E860 Dehydration: Secondary | ICD-10-CM | POA: Insufficient documentation

## 2022-10-27 HISTORY — DX: Anxiety disorder, unspecified: F41.9

## 2022-10-27 LAB — CBC WITH DIFFERENTIAL/PLATELET
Abs Immature Granulocytes: 0.03 10*3/uL (ref 0.00–0.07)
Basophils Absolute: 0.1 10*3/uL (ref 0.0–0.1)
Basophils Relative: 1 %
Eosinophils Absolute: 0.1 10*3/uL (ref 0.0–0.5)
Eosinophils Relative: 1 %
HCT: 44.2 % (ref 36.0–46.0)
Hemoglobin: 15.4 g/dL — ABNORMAL HIGH (ref 12.0–15.0)
Immature Granulocytes: 0 %
Lymphocytes Relative: 23 %
Lymphs Abs: 2.3 10*3/uL (ref 0.7–4.0)
MCH: 32.4 pg (ref 26.0–34.0)
MCHC: 34.8 g/dL (ref 30.0–36.0)
MCV: 92.9 fL (ref 80.0–100.0)
Monocytes Absolute: 0.7 10*3/uL (ref 0.1–1.0)
Monocytes Relative: 7 %
Neutro Abs: 7 10*3/uL (ref 1.7–7.7)
Neutrophils Relative %: 68 %
Platelets: 229 10*3/uL (ref 150–400)
RBC: 4.76 MIL/uL (ref 3.87–5.11)
RDW: 12.9 % (ref 11.5–15.5)
WBC: 10.2 10*3/uL (ref 4.0–10.5)
nRBC: 0 % (ref 0.0–0.2)

## 2022-10-27 LAB — BASIC METABOLIC PANEL
Anion gap: 12 (ref 5–15)
BUN: 6 mg/dL (ref 6–20)
CO2: 24 mmol/L (ref 22–32)
Calcium: 9.5 mg/dL (ref 8.9–10.3)
Chloride: 100 mmol/L (ref 98–111)
Creatinine, Ser: 1.05 mg/dL — ABNORMAL HIGH (ref 0.44–1.00)
GFR, Estimated: >60 mL/min (ref >60)
Glucose, Bld: 110 mg/dL — ABNORMAL HIGH (ref 70–99)
Potassium: 3.1 mmol/L — ABNORMAL LOW (ref 3.5–5.1)
Sodium: 136 mmol/L (ref 135–145)

## 2022-10-27 LAB — TROPONIN I (HIGH SENSITIVITY): Troponin I (High Sensitivity): 4 ng/L (ref <18)

## 2022-10-27 LAB — HCG, QUANTITATIVE, PREGNANCY: hCG, Beta Chain, Quant, S: 1 m[IU]/mL (ref <5)

## 2022-10-27 MED ORDER — KETOROLAC TROMETHAMINE 10 MG PO TABS
10.0000 mg | ORAL_TABLET | Freq: Once | ORAL | Status: AC
  Administered 2022-10-27: 10 mg via ORAL
  Filled 2022-10-27: qty 1

## 2022-10-27 MED ORDER — LORAZEPAM 1 MG PO TABS
1.0000 mg | ORAL_TABLET | Freq: Once | ORAL | Status: AC
  Administered 2022-10-27: 1 mg via ORAL
  Filled 2022-10-27: qty 1

## 2022-10-27 MED ORDER — PROCHLORPERAZINE MALEATE 5 MG PO TABS
10.0000 mg | ORAL_TABLET | Freq: Once | ORAL | Status: AC
  Administered 2022-10-27: 10 mg via ORAL
  Filled 2022-10-27: qty 2

## 2022-10-27 NOTE — ED Triage Notes (Signed)
Pt BIB EMS for anxiety that has been going on since Monday. Per pt, she cannot remember what she takes for anxiety.

## 2022-10-27 NOTE — ED Provider Triage Note (Signed)
Emergency Medicine Provider Triage Evaluation Note  Joyce Mcclure , a 42 y.o. female  was evaluated in triage.  Pt complains of increased levels of anxiety.  She states that typically her anxiety symptoms are manageable but really the last few days she is reporting associated headache, palpitations, and shortness of breath.  Patient does have lorazepam as needed but she has not taken that.  She denies any chest pain.  She also endorses associated generalized weakness.  Review of Systems  Positive:  Negative: See above   Physical Exam  BP 127/89   Pulse 97   Temp 98.1 F (36.7 C) (Oral)   Resp (!) 21   Ht 5\' 5"  (1.651 m)   Wt 52.2 kg   SpO2 100%   BMI 19.14 kg/m  Gen:   Awake, no distress   Resp:  Normal effort  MSK:   Moves extremities without difficulty  Other:    Medical Decision Making  Medically screening exam initiated at 9:51 PM.  Appropriate orders placed.  Joyce Mcclure was informed that the remainder of the evaluation will be completed by another provider, this initial triage assessment does not replace that evaluation, and the importance of remaining in the ED until their evaluation is complete.     Honor Loh [place], [place] 10/27/22 2154

## 2022-10-27 NOTE — ED Provider Notes (Signed)
[place] [facility]PART[facility] AT [facility] Provider Note   CSN: [id number] Arrival date & time: 10/27/22  2105     History  Chief Complaint  Patient presents with   Anxiety    Joyce Mcclure is a 42 y.o. female.  Patient presents to the emergency department complaining of increased anxiety over the past 3 days.  Patient also endorsed chronic headache increasing severity, currently 5 out of 10 in severity.  Patient states this is the worst headache she has ever had.  She endorses extensive previous workup of her headaches including CT scans.  Over the past few days the patient endorses feeling lightheaded with the headaches.  She denies chest pain, shortness of breath, abdominal pain.  She endorses 1 episode of vomiting with her headaches 2 days ago.  She denies fever.  Past medical history significant for anxiety  HPI     Home Medications Prior to Admission medications   Medication Sig Start Date End Date Taking? Authorizing Provider  albuterol (PROVENTIL HFA;VENTOLIN HFA) 108 (90 BASE) MCG/ACT inhaler Inhale 2 puffs into the lungs every 4 (four) hours as needed for wheezing or shortness of breath. 02/05/14   Muthersbaugh, Dahlia Client, PA-C  azithromycin (ZITHROMAX) 250 MG tablet Take 1 tablet (250 mg total) by mouth daily. Take first 2 tablets together, then 1 every day until finished. 02/05/14   Muthersbaugh, Dahlia Client, PA-C  docusate sodium (COLACE) 100 MG capsule Take 1 capsule (100 mg total) by mouth 2 (two) times daily as needed. 06/28/13   Leftwich-Kirby, Wilmer Floor, CNM  HYDROcodone-acetaminophen (HYCET) 7.5-325 mg/15 ml solution Take 15 mLs by mouth every 6 (six) hours as needed for moderate pain or severe pain (For sore throat). 04/09/14   Antony Madura, PA-C  hydrocortisone-pramoxine (PROCTOFOAM HC) rectal foam Place 1 applicator rectally 2 (two) times daily. Patient not taking: No sig reported 07/02/13   Carrington Clamp, MD  lidocaine [facility]) 3 % CREA cream Apply 1  application topically as needed (hemorrhoids).    [provider]  naproxen (NAPROSYN) 500 MG tablet Take 1 tablet (500 mg total) by mouth 2 (two) times daily. 04/09/14   Antony Madura, PA-C  Prenatal Vit-Fe Fumarate-FA (PRENATAL MULTIVITAMIN) TABS tablet Take 1 tablet by mouth daily at 12 noon.    [provider]      Allergies    Patient has no known allergies.    Review of Systems   Review of Systems  Physical Exam Updated Vital Signs BP 102/77 (BP Location: Right Arm)   Pulse 73   Temp 98 F (36.7 C) (Oral)   Resp 18   Ht 5\' 5"  (1.651 m)   Wt 52.2 kg   LMP 10/17/2022   SpO2 100%   BMI 19.14 kg/m  Physical Exam Vitals and nursing note reviewed.  HENT:     Head: Normocephalic and atraumatic.     Mouth/Throat:     Mouth: Mucous membranes are moist.  Eyes:     Extraocular Movements: Extraocular movements intact.     Pupils: Pupils are equal, round, and reactive to light.  Pulmonary:     Effort: Pulmonary effort is normal. No respiratory distress.  Musculoskeletal:        General: No signs of injury.     Cervical back: Normal range of motion and neck supple. No rigidity or tenderness.  Skin:    General: Skin is dry.  Neurological:     General: No focal deficit present.     Mental Status: She  is alert and oriented to person, place, and time.     Sensory: No sensory deficit.     Motor: No weakness.     Gait: Gait normal.  Psychiatric:        Speech: Speech normal.        Behavior: Behavior normal.     ED Results / Procedures / Treatments   Labs (all labs ordered are listed, but only abnormal results are displayed) Labs Reviewed  CBC WITH DIFFERENTIAL/PLATELET - Abnormal; Notable for the following components:      Result Value   Hemoglobin 15.4 (*)    All other components within normal limits  BASIC METABOLIC PANEL - Abnormal; Notable for the following components:   Potassium 3.1 (*)    Glucose, Bld 110 (*)    Creatinine, Ser 1.05 (*)    All  other components within normal limits  HCG, QUANTITATIVE, PREGNANCY  TROPONIN I (HIGH SENSITIVITY)    EKG None  Radiology CT Head Wo Contrast  Result Date: 10/28/2022 CLINICAL DATA:  Headache with increasing frequency EXAM: CT HEAD WITHOUT CONTRAST TECHNIQUE: Contiguous axial images were obtained from the base of the skull through the vertex without intravenous contrast. RADIATION DOSE REDUCTION: This exam was performed according to the departmental dose-optimization program which includes automated exposure control, adjustment of the mA and/or kV according to patient size and/or use of iterative reconstruction technique. COMPARISON:  CT brain 12/14/2021 FINDINGS: Brain: No evidence of acute infarction, hemorrhage, hydrocephalus, extra-axial collection or mass lesion/mass effect. Vascular: No hyperdense vessel or unexpected calcification. Skull: Normal. Negative for fracture or focal lesion. Sinuses/Orbits: No acute finding. Other: None IMPRESSION: Negative non contrasted CT appearance of the brain. Electronically Signed   By: Jasmine Pang M.D.   On: 10/28/2022 00:01   DG Chest 2 View  Result Date: 10/27/2022 CLINICAL DATA:  Shortness of breath EXAM: CHEST - 2 VIEW COMPARISON:  02/05/2014 FINDINGS: The heart size and mediastinal contours are within normal limits. Both lungs are clear. The visualized skeletal structures are unremarkable. IMPRESSION: No active cardiopulmonary disease. Electronically Signed   By: Jasmine Pang M.D.   On: 10/27/2022 23:16    Procedures Procedures    Medications Ordered in ED Medications  LORazepam (ATIVAN) tablet 1 mg (1 mg Oral Given 10/27/22 2205)  prochlorperazine (COMPAZINE) tablet 10 mg (10 mg Oral Given 10/27/22 2304)  ketorolac (TORADOL) tablet 10 mg (10 mg Oral Given 10/27/22 2303)  sodium chloride 0.9 % bolus 1,000 mL ( Intravenous Stopped 10/28/22 0325)    ED Course/ Medical Decision Making/ A&P                                 Medical Decision  Making Amount and/or Complexity of Data Reviewed Labs: ordered. Radiology: ordered.  Risk Prescription drug management.   This patient presents to the ED for concern of headache with anxiety, this involves an extensive number of treatment options, and is a complaint that carries with it a high risk of complications and morbidity.  The differential diagnosis includes migraine, cluster headache, tension headache, intracranial abnormality. The differential includes generalized anxiety,metabolic abnormalities, substance abuse, others Patient also complains of vague lightheadedness with differential including intracranial abnormality, dehydration, dysrhythmia, others   Co morbidities that complicate the patient evaluation  Anxiety, headaches   Additional history obtained:   External records from outside source obtained and reviewed including normal CT head from October, 2023   Lab Tests:  I Ordered, and personally interpreted labs.  The pertinent results include:  Potassium 3.1, troponin 4, hCG 1 (last period 10/17/22)   Imaging Studies ordered:  I ordered imaging studies including chest x-ray, CT head w/o contrast  I independently visualized and interpreted imaging which showed no acute abnormalities on head CT or chest x-ray I agree with the radiologist interpretation   Cardiac Monitoring: / EKG:  The patient was maintained on a cardiac monitor.  I personally viewed and interpreted the cardiac monitored which showed an underlying rhythm of: Normal sinus rhythm with sinus arrhythmia   Problem List / ED Course / Critical interventions / Medication management   I ordered medication including Ativan for anxiety,compazine and toradol for headache.  Normal saline bolus for fluid resuscitation Reevaluation of the patient after these medicines showed that the patient improved I have reviewed the patients home medicines and have made adjustments as needed   Social Determinants of  Health:  Patient has Medicaid for her primary health insurance   Test / Admission - Considered:  The patient's head CT was unremarkable.  She has had headaches intermittently for well over a year.  Her headache improved from moderate to mild with medication.  At this time plan to discharge home with plans for neurology follow-up for further evaluation of patient's chronic headaches. Patient's anxiety improved with Ativan.  Plan to have patient follow-up with primary team for further management of anxiety  Patient's lightheadedness seems to be related to possible dehydration with positive orthostatic changes.  The patient was administered 1 L of normal saline and was no longer orthostatic.  There is no indication at this time for further emergent workup or hospital admission.  Plan to discharge home with neurology referral.         Final Clinical Impression(s) / ED Diagnoses Final diagnoses:  Anxiety  Dehydration  Chronic nonintractable headache, unspecified headache type    Rx / DC Orders ED Discharge Orders          Ordered    Ambulatory referral to Neurology       Comments: An appointment is requested in approximately: 2 weeks   10/28/22 0353              Darrick Grinder, PA-C 10/28/22 0354    Gilda Crease, MD 10/28/22 [phone]

## 2022-10-28 MED ORDER — SODIUM CHLORIDE 0.9 % IV BOLUS
1000.0000 mL | Freq: Once | INTRAVENOUS | Status: AC
  Administered 2022-10-28: 1000 mL via INTRAVENOUS

## 2022-10-28 NOTE — Discharge Instructions (Signed)
Your workup tonight was reassuring.  There were no acute findings on your CT scan.  You did appear to be mildly dehydrated.  Please be sure to continue to hydrate at home as you are able.  Please follow-up with your primary care provider to discuss your recent lightheadedness along with your anxiety. I have placed a referral with neurology for further evaluation of your chronic headaches.  They should contact you to schedule an appointment. If you develop any life-threatening symptoms please return to the emergency department.

## 2022-11-28 ENCOUNTER — Ambulatory Visit (HOSPITAL_COMMUNITY)
Admission: EM | Admit: 2022-11-28 | Discharge: 2022-11-28 | Disposition: A | Discharge status: Home / Self Care | Payer: Medicaid Other | Attending: Psychiatry | Admitting: Psychiatry

## 2022-11-28 DIAGNOSIS — F32A Depression, unspecified: Secondary | ICD-10-CM | POA: Insufficient documentation

## 2022-11-28 DIAGNOSIS — R4589 Other symptoms and signs involving emotional state: Secondary | ICD-10-CM

## 2022-11-28 DIAGNOSIS — F4310 Post-traumatic stress disorder, unspecified: Secondary | ICD-10-CM | POA: Insufficient documentation

## 2022-11-28 DIAGNOSIS — F418 Other specified anxiety disorders: Secondary | ICD-10-CM

## 2022-11-28 DIAGNOSIS — F419 Anxiety disorder, unspecified: Secondary | ICD-10-CM | POA: Insufficient documentation

## 2022-11-28 DIAGNOSIS — G4700 Insomnia, unspecified: Secondary | ICD-10-CM | POA: Insufficient documentation

## 2022-11-28 NOTE — Discharge Instructions (Signed)
F/u with walk-in psychiatry and or outpatient resources provided

## 2022-11-28 NOTE — ED Provider Notes (Incomplete)
Behavioral Health Urgent Care Medical Screening Exam  Patient Name: Joyce Mcclure MRN: [id number] Date of Evaluation: 11/28/22 Chief Complaint:  increase anxiety unable to sleep since 3 Am this morning  Diagnosis:  Final diagnoses:  Other specified anxiety disorders  Insomnia, unspecified type  Ineffective coping    History of Present illness: Joyce Mcclure is a 42 y.o. female. With a history PTSD,  Anxiety and depression. Present to [facility] via EMS for increase anxiety, according to the patient her friend died 2 years ago and they both were not on speaking terms and according to the patient her birthday is coming up in a couple of days and she just feels like if she go to sleep she is not going to wake up too.  Per the patient she has been up since 3 AM this morning and every time she tried to go to sleep whenever she reached a REM sleep she would get afraid that she may not wake up.  According to the patient she needs somebody to watch to her while she is sleep.  Patient is prescribed Xanax and Zoloft by her PCP.  Patient is currently not seeing a psychiatrist or therapist.  According to the patient the passing of her best friend triggers her and that is why she cannot sleep.  Per the patient she lives with her 40 year old and 67 year old children. Patient denies any prior hospitalization for any psychiatric's problem.  Face-to-face observation of patient, patient is alert and oriented x 4, speech is clear, maintaining eye contact.  Patient does appear to be anxious.  Patient denies SI, HI, AVH.  Patient does report that she feels that if she goes to sleep she might not wake up and she wants someone to watch her.  Patient denies alcohol use, reports cigarette smoking.  Patient denies any other illicit drug use at this time. Writer spoke with patient about grief counseling and also provide patient.  Resources.  Patient was also instructed that she could reach out to walking psychiatry.  Patient  was also given the option if she wanted to stay to be observed overnight however when patient heard that she was not going to get a private room and she could not keep her cell phone on the unit patient stated now she is not staying in a area with other people.  Patient stated she would rather go home however patient asked if she we provided transportation services discussed with patient that we could give her a bus pass patient stated I do not take the bus.  Patient was instructed to dial 911 should she experience any suicidal ideation homicidal ideation or any deterioration in her mental health.  Patient verbalized understanding.  Recommend discharge the patient to follow-up with outpatient services provided. Flowsheet Row ED from 11/28/2022 in [facility] ED from 10/27/2022 in [facility] Emergency Department at [facility] ED from 12/14/2021 in [facility] Emergency Department at [facility]  C-SSRS RISK CATEGORY No Risk No Risk No Risk       Psychiatric Specialty Exam  Presentation  General Appearance:Casual  Eye Contact:Good  Speech:Clear and Coherent  Speech Volume:Normal  Handedness:Right   Mood and Affect  Mood: Anxious  Affect: Appropriate   Thought Process  Thought Processes: Coherent  Descriptions of Associations:Circumstantial  Orientation:Full (Time, Place and Person)  Thought Content:WDL    Hallucinations:None  Ideas of Reference:Paranoia  Suicidal Thoughts:No  Homicidal Thoughts:No   Sensorium  Memory: Immediate Good  Judgment:  Fair  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   HENT: Negative.    Eyes: Negative.   Respiratory: Negative.    Cardiovascular: Negative.   Gastrointestinal: Negative.   Genitourinary: Negative.   Musculoskeletal: Negative.   Skin: Negative.   Neurological: Negative.   Psychiatric/Behavioral:  The patient is nervous/anxious.    Blood pressure 129/88, pulse 90, temperature 98.9 F (37.2 C), temperature source Oral, resp. rate 20, SpO2 100%. There is no height or weight on file to calculate BMI.  Musculoskeletal: Strength & Muscle Tone: within normal limits Gait & Station: normal Patient leans: N/A   [facility] MSE Discharge Disposition for Follow up and Recommendations: Based on my evaluation the patient does not appear to have an emergency medical condition and can be discharged with resources and follow up care in outpatient services for Individual Therapy   Sindy Guadeloupe, NP 11/28/2022, 10:55 PM

## 2022-11-28 NOTE — ED Notes (Signed)
[employer] requested for home.  Taxi Voucher

## 2022-11-28 NOTE — Progress Notes (Signed)
   11/28/22 2203  [facility] Triage Screening (Walk-ins at [facility] only)  How Did You Hear About [place]? Other (Comment) (EMS)  What Is the Reason for Your Visit/Call Today? Pt presents to [facility] voluntarily, accompanied by EMS with complaint of worsening anxiety. Pt reports she has not slept in days and has been afraid to go to sleep in fear of not waking up. Pt reports taking Zoloft this morning and then later this evening around 5pm she took Xanax. Pt has had no relief and still feels very anxious. Pt is linked to PCP Dr. Lyda Perone at [facility]. Pt is not linked to any outpatient resources at this time. Pt currently denies SI,HI, AVH and substance/alcohol use.  How Long Has This Been Causing You Problems? <Week  Have You Recently Had Any Thoughts About Hurting Yourself? No  Are You Planning to Commit Suicide/Harm Yourself At This time? No  Have you Recently Had Thoughts About Hurting Someone Karolee Ohs? No  Are You Planning To Harm Someone At This Time? No  Are you currently experiencing any auditory, visual or other hallucinations? No  Have You Used Any Alcohol or Drugs in the Past 24 Hours? No  Do you have any current medical co-morbidities that require immediate attention? No  Clinician description of patient physical appearance/behavior: pt is cooperative, pleasant. Casually dressed  What Do You Feel Would Help You the Most Today? Treatment for Depression or other mood problem  If access to [facility] Urgent Care was not available, would you have sought care in the Emergency Department? Yes  Determination of Need Urgent (48 hours)  Options For Referral Other: Comment;Outpatient Therapy;Medication Management;BH Urgent Care

## 2022-11-28 NOTE — ED Provider Notes (Signed)
Behavioral Health Urgent Care Medical Screening Exam  Patient Name: Joyce Mcclure MRN: [id number] Date of Evaluation: 11/29/22 Chief Complaint:  increase anxiety unable to sleep since 3 Am this morning  Diagnosis:  Final diagnoses:  Other specified anxiety disorders  Insomnia, unspecified type  Ineffective coping    History of Present illness: LATRELLE FENNING is a 42 y.o. female. With a history PTSD,  Anxiety and depression. Present to [facility] via EMS for increase anxiety, according to the patient her friend died 2 years ago and they both were not on speaking terms and according to the patient her birthday is coming up in a couple of days and she just feels like if she go to sleep she is not going to wake up too.  Per the patient she has been up since 3 AM this morning and every time she tried to go to sleep whenever she reached a REM sleep she would get afraid that she may not wake up.  According to the patient she needs somebody to watch to her while she is sleep.  Patient is prescribed Xanax and Zoloft by her PCP.  Patient is currently not seeing a psychiatrist or therapist.  According to the patient the passing of her best friend triggers her and that is why she cannot sleep.  Per the patient she lives with her 40 year old and 27 year old children. Patient denies any prior hospitalization for any psychiatric's problem.  Face-to-face observation of patient, patient is alert and oriented x 4, speech is clear, maintaining eye contact.  Patient does appear to be anxious.  Patient denies SI, HI, AVH.  Patient does report that she feels that if she goes to sleep she might not wake up and she wants someone to watch her.  Patient denies alcohol use, reports cigarette smoking.  Patient denies any other illicit drug use at this time. Writer spoke with patient about grief counseling and also provide patient.  Resources.  Patient was also instructed that she could reach out to walking psychiatry.  Patient  was also given the option if she wanted to stay to be observed overnight however when patient heard that she was not going to get a private room and she could not keep her cell phone on the unit patient stated now she is not staying in a area with other people.  Patient stated she would rather go home however patient asked if she we provided transportation services discussed with patient that we could give her a bus pass patient stated I do not take the bus.  Patient was instructed to dial 911 should she experience any suicidal ideation homicidal ideation or any deterioration in her mental health.  Patient verbalized understanding.  Recommend discharge the patient to follow-up with outpatient services provided. Flowsheet Row ED from 11/28/2022 in [facility] ED from 10/27/2022 in [facility] Emergency Department at [facility] ED from 12/14/2021 in [facility] Emergency Department at [facility]  C-SSRS RISK CATEGORY No Risk No Risk No Risk       Psychiatric Specialty Exam  Presentation  General Appearance:Casual  Eye Contact:Good  Speech:Clear and Coherent  Speech Volume:Normal  Handedness:Right   Mood and Affect  Mood: Anxious  Affect: Appropriate   Thought Process  Thought Processes: Coherent  Descriptions of Associations:Circumstantial  Orientation:Full (Time, Place and Person)  Thought Content:WDL    Hallucinations:None  Ideas of Reference:Paranoia  Suicidal Thoughts:No  Homicidal Thoughts:No   Sensorium  Memory: Immediate Good  Judgment:  Fair  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   HENT: Negative.    Eyes: Negative.   Respiratory: Negative.    Cardiovascular: Negative.   Gastrointestinal: Negative.   Genitourinary: Negative.   Musculoskeletal: Negative.   Skin: Negative.   Neurological: Negative.   Psychiatric/Behavioral:  The patient is nervous/anxious.    Blood pressure 129/88, pulse 90, temperature 98.9 F (37.2 C), temperature source Oral, resp. rate 20, SpO2 100%. There is no height or weight on file to calculate BMI.  Musculoskeletal: Strength & Muscle Tone: within normal limits Gait & Station: normal Patient leans: N/A   [facility] MSE Discharge Disposition for Follow up and Recommendations: Based on my evaluation the patient does not appear to have an emergency medical condition and can be discharged with resources and follow up care in outpatient services for Individual Therapy   Sindy Guadeloupe, NP 11/29/2022, 4:31 AM

## 2022-11-30 ENCOUNTER — Other Ambulatory Visit: Payer: Self-pay

## 2022-11-30 ENCOUNTER — Encounter (HOSPITAL_COMMUNITY): Payer: Self-pay

## 2022-11-30 ENCOUNTER — Emergency Department (HOSPITAL_COMMUNITY)
Admission: EM | Admit: 2022-11-30 | Discharge: 2022-11-30 | Disposition: A | Discharge status: Home / Self Care | Payer: Medicaid Other | Attending: Student | Admitting: Student

## 2022-11-30 DIAGNOSIS — F17210 Nicotine dependence, cigarettes, uncomplicated: Secondary | ICD-10-CM | POA: Insufficient documentation

## 2022-11-30 DIAGNOSIS — F411 Generalized anxiety disorder: Secondary | ICD-10-CM | POA: Diagnosis present

## 2022-11-30 DIAGNOSIS — F22 Delusional disorders: Secondary | ICD-10-CM | POA: Diagnosis not present

## 2022-11-30 DIAGNOSIS — F419 Anxiety disorder, unspecified: Secondary | ICD-10-CM | POA: Diagnosis present

## 2022-11-30 LAB — CBC WITH DIFFERENTIAL/PLATELET
Abs Immature Granulocytes: 0.03 10*3/uL (ref 0.00–0.07)
Basophils Absolute: 0 10*3/uL (ref 0.0–0.1)
Basophils Relative: 0 %
Eosinophils Absolute: 0 10*3/uL (ref 0.0–0.5)
Eosinophils Relative: 0 %
HCT: 40.7 % (ref 36.0–46.0)
Hemoglobin: 14.2 g/dL (ref 12.0–15.0)
Immature Granulocytes: 0 %
Lymphocytes Relative: 26 %
Lymphs Abs: 2.2 10*3/uL (ref 0.7–4.0)
MCH: 31.8 pg (ref 26.0–34.0)
MCHC: 34.9 g/dL (ref 30.0–36.0)
MCV: 91.3 fL (ref 80.0–100.0)
Monocytes Absolute: 0.7 10*3/uL (ref 0.1–1.0)
Monocytes Relative: 9 %
Neutro Abs: 5.5 10*3/uL (ref 1.7–7.7)
Neutrophils Relative %: 65 %
Platelets: 229 10*3/uL (ref 150–400)
RBC: 4.46 MIL/uL (ref 3.87–5.11)
RDW: 12.5 % (ref 11.5–15.5)
WBC: 8.4 10*3/uL (ref 4.0–10.5)
nRBC: 0 % (ref 0.0–0.2)

## 2022-11-30 LAB — COMPREHENSIVE METABOLIC PANEL WITH GFR
ALT: 17 U/L (ref 0–44)
AST: 22 U/L (ref 15–41)
Albumin: 4.5 g/dL (ref 3.5–5.0)
Alkaline Phosphatase: 47 U/L (ref 38–126)
Anion gap: 12 (ref 5–15)
BUN: 6 mg/dL (ref 6–20)
CO2: 22 mmol/L (ref 22–32)
Calcium: 9.8 mg/dL (ref 8.9–10.3)
Chloride: 101 mmol/L (ref 98–111)
Creatinine, Ser: 0.68 mg/dL (ref 0.44–1.00)
GFR, Estimated: >60 mL/min (ref >60)
Glucose, Bld: 98 mg/dL (ref 70–99)
Potassium: 3.7 mmol/L (ref 3.5–5.1)
Sodium: 135 mmol/L (ref 135–145)
Total Bilirubin: 1.3 mg/dL — ABNORMAL HIGH (ref 0.3–1.2)
Total Protein: 7.5 g/dL (ref 6.5–8.1)

## 2022-11-30 NOTE — ED Provider Notes (Signed)
Accepted handoff at shift change from [facility]. Please see prior provider note for full HPI.  Briefly: Patient is a 42 y.o. female who presents to the ER for anxiety and paranoia. Recently started on xanax and zoloft. Went to behavioral health urgent care but sent to ER for "dehydrating". Denies SI, HI, or AVH.   DDX/Plan: Follow up on blood work, plan for TTS consultation.   ED Course / MDM    Medical Decision Making Amount and/or Complexity of Data Reviewed Labs: ordered.   Pt eloped prior to my evaluation. It was my understanding that she was here voluntarily and did not demonstrate a harm to herself or others. Patient was observed to be leaving after psychiatric consultation without notifying staff.        Jeanella Flattery 11/30/22 1631    Charlynne Pander, MD 11/30/22 [id number]

## 2022-11-30 NOTE — ED Notes (Signed)
Sent pt to restroom for urine

## 2022-11-30 NOTE — ED Triage Notes (Signed)
Hx of anxiety, prescribed medication, but will not take because she is anxious due to family hx of addiction.

## 2022-11-30 NOTE — ED Provider Notes (Signed)
[facility] EMERGENCY DEPARTMENT AT [facility] Provider Note   CSN: [id number] Arrival date & time: 11/30/22  1219     History  Chief Complaint  Patient presents with   Anxiety    Margerie SHADIE PAWLOWSKI is a 42 y.o. female.  42 year old female with a past medical history of anxiety presents to the ED with a chief complaint of paranoia along with anxiety that is been ongoing for some time.  She was prescribed her primary care physician some Xanax 0.25, along with Zoloft which she has been taking for about a week.  She experiences adverse effects of this medication, feels overall dehydrated and rundown.  Reports she has not slept in about 24 hours, she is having thoughts of paranoia that if she goes to sleep she may not wake up.  She feels that she is unable to actually get any rest.  She tried going to be [facility] who sent her to the emergency department for further evaluation.  She also feels like she is dehydrated, although she is telling me that she drinks water and then pees immediately.  No prior history of diabetes.  She is also requesting an IV for hydration. She is accompanied by her child Maya at the bedside. No chest pain, no shortness of breath or other complaints.   The history is provided by the patient and medical records.  Anxiety       Home Medications Prior to Admission medications   Medication Sig Start Date End Date Taking? Authorizing Provider  albuterol (PROVENTIL HFA;VENTOLIN HFA) 108 (90 BASE) MCG/ACT inhaler Inhale 2 puffs into the lungs every 4 (four) hours as needed for wheezing or shortness of breath. 02/05/14   Muthersbaugh, Dahlia Client, PA-C  azithromycin (ZITHROMAX) 250 MG tablet Take 1 tablet (250 mg total) by mouth daily. Take first 2 tablets together, then 1 every day until finished. 02/05/14   Muthersbaugh, Dahlia Client, PA-C  docusate sodium (COLACE) 100 MG capsule Take 1 capsule (100 mg total) by mouth 2 (two) times daily as needed. 06/28/13   Leftwich-Kirby,  Wilmer Floor, CNM  HYDROcodone-acetaminophen (HYCET) 7.5-325 mg/15 ml solution Take 15 mLs by mouth every 6 (six) hours as needed for moderate pain or severe pain (For sore throat). 04/09/14   Antony Madura, PA-C  hydrocortisone-pramoxine (PROCTOFOAM HC) rectal foam Place 1 applicator rectally 2 (two) times daily. Patient not taking: No sig reported 07/02/13   Carrington Clamp, MD  lidocaine [facility]) 3 % CREA cream Apply 1 application topically as needed (hemorrhoids).    [provider]  naproxen (NAPROSYN) 500 MG tablet Take 1 tablet (500 mg total) by mouth 2 (two) times daily. 04/09/14   Antony Madura, PA-C  Prenatal Vit-Fe Fumarate-FA (PRENATAL MULTIVITAMIN) TABS tablet Take 1 tablet by mouth daily at 12 noon.    [provider]      Allergies    Patient has no known allergies.    Review of Systems   Review of Systems  Constitutional:  Negative for chills and fever.  Psychiatric/Behavioral:  The patient is nervous/anxious.     Physical Exam Updated Vital Signs BP (!) 126/90 (BP Location: Left Arm)   Pulse 97   Temp 98.3 F (36.8 C) (Oral)   Resp 18   Ht 5\' 5"  (1.651 m)   Wt 52.2 kg   SpO2 98%   BMI 19.15 kg/m  Physical Exam Vitals and nursing note reviewed.  Constitutional:      Appearance: Normal appearance. She is not ill-appearing.  HENT:  Head: Normocephalic and atraumatic.     Mouth/Throat:     Mouth: Mucous membranes are moist.  Eyes:     Pupils: Pupils are equal, round, and reactive to light.  Cardiovascular:     Rate and Rhythm: Normal rate.  Pulmonary:     Effort: Pulmonary effort is normal.  Abdominal:     General: Abdomen is flat.  Musculoskeletal:     Cervical back: Normal range of motion and neck supple.  Skin:    General: Skin is warm and dry.  Neurological:     Mental Status: She is alert and oriented to person, place, and time.  Psychiatric:        Attention and Perception: Attention normal.        Mood and Affect: Affect is  flat.        Speech: Speech is delayed.        Behavior: Behavior is slowed.        Thought Content: Thought content is paranoid.     ED Results / Procedures / Treatments   Labs (all labs ordered are listed, but only abnormal results are displayed) Labs Reviewed  CBC WITH DIFFERENTIAL/PLATELET  COMPREHENSIVE METABOLIC PANEL  URINALYSIS, ROUTINE W REFLEX MICROSCOPIC  POCT URINE DRUG SCREEN - MANUAL ENTRY (I-SCREEN)    EKG None  Radiology No results found.  Procedures Procedures    Medications Ordered in ED Medications - No data to display  ED Course/ Medical Decision Making/ A&P                                 Medical Decision Making Amount and/or Complexity of Data Reviewed Labs: ordered.   Patient here with a history of anxiety currently taking Xanax 0.25 along with Zoloft which she was started on about a week ago, having some adverse effects to medications.  Called her PCP to arrival who recommended to be evaluated in the ER.  Reports she has not fallen asleep in approximately 24 hours, did take a Xanax when she was in the waiting room but she felt "this place is overwhelming ", she is accompanied by her small child Renae Fickle, reports she did not drive into the ED today.  She does report that her anxiety feels somewhat triggered by her birthday, she try going to be hooked in order to obtain some help however they sent her to the emergency department.  She is requesting an IV for dehydration although she reports voiding plenty, will check CBG along with blood work.  She does deny any SI, HI, visual or auditory hallucinations.  However continues to endorse paranoia of falling asleep.  She does not wish to obtain any medication at this time.  Medical clearance along with tts consultation. Patient care signed out to incoming team.   Portions of this note were generated with Dragon dictation software. Dictation errors may occur despite best attempts at proofreading.   Final  Clinical Impression(s) / ED Diagnoses Final diagnoses:  Anxiety  Paranoid [facility])    Rx / DC Orders ED Discharge Orders     None         Claude Manges, PA-C 11/30/22 1506    Kommor, [place], MD 11/30/22 2103

## 2022-11-30 NOTE — Consult Note (Signed)
[facility] ED ASSESSMENT   Reason for Consult:  paranoid Referring Physician:  Claude Manges Patient Identification: Joyce Mcclure MRN:  [id number] ED Chief Complaint: Generalized anxiety disorder  Diagnosis:  Principal Problem:   Generalized anxiety disorder   ED Assessment Time Calculation: Start Time: 1400 Stop Time: 1500 Total Time in Minutes (Assessment Completion): 60  HPI:  Joyce Mcclure is a 42 y.o. female patient presented to the ED today accompanied by her minor daughter complaining of anxiety.  She has a history of seasonal allergies.  Subjective:   Joyce Mcclure is a 42 y.o. female patient presented to the ED today accompanied by her minor daughter complaining of anxiety.  She has a history of seasonal allergies.  Joyce Mcclure, 42 y.o., female patient seen face to face by this provider, consulted with Dr. Lucianne Muss; and chart reviewed on 11/30/22.  On evaluation Joyce Mcclure reports she is feeling anxious and is having trouble sleeping.  Patient said she does not want to take the xanax that has been prescribed because she is concerned about addiction due to a family history of addiction issues.  Patient says she is taking the zoloft that was prescribed by her PCP last week, but that she hasn't noticed a difference yet. Discussed with patient that the medication may require a few weeks before she will feel the full effect.  Patient asked if there was anything over the counter she could take to help with sleep.  Provider recommended trying a melatonin supplement and told patient she could also use a dose of benadryl to assist with sleep. Recommended patient establish care with outpatient psychiatric provider and therapist.   During evaluation Joyce Mcclure is seated in no acute distress.  She is alert, oriented x 4, calm, cooperative and attentive.  Her mood is anxious with congruent affect.  She has normal speech, and behavior.  Objectively there is no evidence of psychosis/mania or  delusional thinking.  Patient is able to converse coherently, goal directed thoughts, no distractibility, or pre-occupation.  She also denies suicidal/self-harm/homicidal ideation, psychosis, and paranoia.  Patient answered questions appropriately.    Patient is not a danger to herself or anyone else.  She is not psychotic.  Patient does not meet criteria for IVC or inpatient psychiatric hospitalization.    Past Psychiatric History: Recent dx of anxiety  Risk to Self or Others: Is the patient at risk to self? No Has the patient been a risk to self in the past 6 months? No Has the patient been a risk to self within the distant past? No Is the patient a risk to others? No Has the patient been a risk to others in the past 6 months? No Has the patient been a risk to others within the distant past? No  [place] Scale:  Flowsheet Row ED from 11/30/2022 in [facility] Emergency Department at [facility] ED from 11/28/2022 in [facility] ED from 10/27/2022 in [facility] Emergency Department at [facility]  C-SSRS RISK CATEGORY No Risk No Risk No Risk        Substance Abuse:   Patient denies  Past Medical History:  Past Medical History:  Diagnosis Date   Anxiety     Past Surgical History:  Procedure Laterality Date   NO PAST SURGERIES     Family History: History reviewed. No pertinent family history. Family Psychiatric  History: Substance abuse  Social History:  Social History   Substance and  Sexual Activity  Alcohol Use No     Social History   Substance and Sexual Activity  Drug Use No    Social History   Socioeconomic History   Marital status: Single    Spouse name: Not on file   Number of children: Not on file   Years of education: Not on file   Highest education level: Not on file  Occupational History   Not on file  Tobacco Use   Smoking status: Every Day    Current packs/day: 0.25    Types: Cigarettes   Smokeless  tobacco: Never  Substance and Sexual Activity   Alcohol use: No   Drug use: No   Sexual activity: Yes  Other Topics Concern   Not on file  Social History Narrative   Not on file   Social Determinants of Health   Financial Resource Strain: Not on file  Food Insecurity: Not on file  Transportation Needs: Not on file  Physical Activity: Not on file  Stress: Not on file  Social Connections: Unknown (07/24/2021)   Received from [facility]   Social Network    Social Network: Not on file   Additional Social History: Patient lives with her two daughters.    Allergies:  No Known Allergies  Labs:  Results for orders placed or performed during the hospital encounter of 11/30/22 (from the past 48 hour(s))  CBC with Differential     Status: None   Collection Time: 11/30/22  2:13 PM  Result Value Ref Range   WBC 8.4 4.0 - 10.5 K/uL   RBC 4.46 3.87 - 5.11 MIL/uL   Hemoglobin 14.2 12.0 - 15.0 g/dL   HCT 69.6 29.5 - 28.4 %   MCV 91.3 80.0 - 100.0 fL   MCH 31.8 26.0 - 34.0 pg   MCHC 34.9 30.0 - 36.0 g/dL   RDW 13.2 44.0 - 10.2 %   Platelets 229 150 - 400 K/uL   nRBC 0.0 0.0 - 0.2 %   Neutrophils Relative % 65 %   Neutro Abs 5.5 1.7 - 7.7 K/uL   Lymphocytes Relative 26 %   Lymphs Abs 2.2 0.7 - 4.0 K/uL   Monocytes Relative 9 %   Monocytes Absolute 0.7 0.1 - 1.0 K/uL   Eosinophils Relative 0 %   Eosinophils Absolute 0.0 0.0 - 0.5 K/uL   Basophils Relative 0 %   Basophils Absolute 0.0 0.0 - 0.1 K/uL   Immature Granulocytes 0 %   Abs Immature Granulocytes 0.03 0.00 - 0.07 K/uL    Comment: Performed at [facility], [address], [place], [place] [address]    No current facility-administered medications for this encounter.   Current Outpatient Medications  Medication Sig Dispense Refill   albuterol (PROVENTIL HFA;VENTOLIN HFA) 108 (90 BASE) MCG/ACT inhaler Inhale 2 puffs into the lungs every 4 (four) hours as needed for wheezing or shortness  of breath. 1 Inhaler 3   azithromycin (ZITHROMAX) 250 MG tablet Take 1 tablet (250 mg total) by mouth daily. Take first 2 tablets together, then 1 every day until finished. 6 tablet 0   docusate sodium (COLACE) 100 MG capsule Take 1 capsule (100 mg total) by mouth 2 (two) times daily as needed. 30 capsule 2   HYDROcodone-acetaminophen (HYCET) 7.5-325 mg/15 ml solution Take 15 mLs by mouth every 6 (six) hours as needed for moderate pain or severe pain (For sore throat). 120 mL 0   hydrocortisone-pramoxine (PROCTOFOAM HC) rectal foam Place  1 applicator rectally 2 (two) times daily. (Patient not taking: No sig reported) 10 g 0   lidocaine (LINDAMANTLE) 3 % CREA cream Apply 1 application topically as needed (hemorrhoids).     naproxen (NAPROSYN) 500 MG tablet Take 1 tablet (500 mg total) by mouth 2 (two) times daily. 30 tablet 0   Prenatal Vit-Fe Fumarate-FA (PRENATAL MULTIVITAMIN) TABS tablet Take 1 tablet by mouth daily at 12 noon.      Musculoskeletal: Strength & Muscle Tone: within normal limits Gait & Station: normal Patient leans: N/A   Psychiatric Specialty Exam: Presentation  General Appearance:  Appropriate for Environment  Eye Contact: Good  Speech: Clear and Coherent  Speech Volume: Normal  Handedness: Right   Mood and Affect  Mood: Anxious  Affect: Congruent   Thought Process  Thought Processes: Coherent  Descriptions of Associations:Circumstantial  Orientation:Full (Time, Place and Person)  Thought Content:WDL  History of Schizophrenia/Schizoaffective disorder:No data recorded Duration of Psychotic Symptoms:No data recorded Hallucinations:Hallucinations: None  Ideas of Reference:Paranoia  Suicidal Thoughts:Suicidal Thoughts: No  Homicidal Thoughts:Homicidal Thoughts: No   Sensorium  Memory: Immediate Good; Recent Good; Remote Good  Judgment: Impaired  Insight: Poor   Executive Functions  Concentration: Good  Attention  Span: Good  Recall: Good  Fund of Knowledge: Good  Language: Good   Psychomotor Activity  Psychomotor Activity: Psychomotor Activity: Normal   Assets  Assets: Desire for Improvement; Communication Skills; Housing    Sleep  Sleep: Sleep: Poor Number of Hours of Sleep: 2   Physical Exam: Physical Exam Vitals and nursing note reviewed.  Eyes:     Pupils: Pupils are equal, round, and reactive to light.  Pulmonary:     Effort: Pulmonary effort is normal.  Skin:    General: Skin is dry.  Neurological:     Mental Status: She is alert and oriented to person, place, and time.    Review of Systems  Psychiatric/Behavioral:  The patient is nervous/anxious.   All other systems reviewed and are negative.  Blood pressure (!) 126/90, pulse 97, temperature 98.3 F (36.8 C), temperature source Oral, resp. rate 18, height 5\' 5"  (1.651 m), weight 52.2 kg, SpO2 98%. Body mass index is 19.15 kg/m.  Medical Decision Making: Patient case reviewed and discussed with Dr. Lucianne Muss.  Patient is not a danger to herself or anyone else.  She is not psychotic.  Patient does not meet criteria for IVC or inpatient psychiatric hospitalization.    Problem 1: Generalized anxiety disorder -continue current medications -establish care with an outpatient psychiatric provider and therapist  Disposition: No evidence of imminent risk to self or others at present.   Patient does not meet criteria for psychiatric inpatient admission. Patient is psychiatrically cleared.  Thomes Lolling, NP 11/30/2022 3:45 PM

## 2022-12-02 ENCOUNTER — Emergency Department (HOSPITAL_COMMUNITY)
Admission: EM | Admit: 2022-12-02 | Discharge: 2022-12-02 | Disposition: A | Discharge status: Home / Self Care | Payer: Medicaid Other | Attending: Emergency Medicine | Admitting: Emergency Medicine

## 2022-12-02 DIAGNOSIS — E876 Hypokalemia: Secondary | ICD-10-CM | POA: Diagnosis not present

## 2022-12-02 DIAGNOSIS — F419 Anxiety disorder, unspecified: Secondary | ICD-10-CM | POA: Diagnosis present

## 2022-12-02 DIAGNOSIS — T424X5A Adverse effect of benzodiazepines, initial encounter: Secondary | ICD-10-CM | POA: Diagnosis not present

## 2022-12-02 DIAGNOSIS — T887XXA Unspecified adverse effect of drug or medicament, initial encounter: Secondary | ICD-10-CM

## 2022-12-02 LAB — URINALYSIS, ROUTINE W REFLEX MICROSCOPIC
Bilirubin Urine: NEGATIVE
Glucose, UA: NEGATIVE mg/dL
Ketones, ur: NEGATIVE mg/dL
Nitrite: NEGATIVE
Protein, ur: 100 mg/dL — AB
Specific Gravity, Urine: 1.002 — ABNORMAL LOW (ref 1.005–1.030)
pH: 6 (ref 5.0–8.0)

## 2022-12-02 LAB — RAPID URINE DRUG SCREEN, HOSP PERFORMED
Amphetamines: NOT DETECTED
Barbiturates: NOT DETECTED
Benzodiazepines: NOT DETECTED
Cocaine: NOT DETECTED
Opiates: NOT DETECTED
Tetrahydrocannabinol: NOT DETECTED

## 2022-12-02 LAB — I-STAT CHEM 8, ED
BUN: <3 mg/dL — ABNORMAL LOW (ref 6–20)
Calcium, Ion: 1.16 mmol/L (ref 1.15–1.40)
Chloride: 101 mmol/L (ref 98–111)
Creatinine, Ser: 0.6 mg/dL (ref 0.44–1.00)
Glucose, Bld: 102 mg/dL — ABNORMAL HIGH (ref 70–99)
HCT: 38 % (ref 36.0–46.0)
Hemoglobin: 12.9 g/dL (ref 12.0–15.0)
Potassium: 3 mmol/L — ABNORMAL LOW (ref 3.5–5.1)
Sodium: 138 mmol/L (ref 135–145)
TCO2: 22 mmol/L (ref 22–32)

## 2022-12-02 MED ORDER — POTASSIUM CHLORIDE CRYS ER 20 MEQ PO TBCR: 20.0000 meq | EXTENDED_RELEASE_TABLET | Freq: Two times a day (BID) | ORAL | 0 refills | Status: AC

## 2022-12-02 MED ORDER — POTASSIUM CHLORIDE 20 MEQ PO PACK
40.0000 meq | PACK | ORAL | Status: AC
  Administered 2022-12-02: 40 meq via ORAL
  Filled 2022-12-02: qty 2

## 2022-12-02 NOTE — ED Notes (Signed)
Pt declined to change into gown stating she didn't feel that is necessary at this time.

## 2022-12-02 NOTE — ED Provider Notes (Signed)
[place] EMERGENCY DEPARTMENT AT [facility] Provider Note   CSN: [id number] Arrival date & time: 12/02/22  6578     History  Chief Complaint  Patient presents with   Anxiety    Joyce Mcclure is a 42 y.o. female.  HPI 42 year old female history of depression anxiety presents today saying that she is having side effects from her new medication.  She states that she was placed on Xanax 0.25 mg nightly and as needed on 911.  She states that whenever she takes Xanax she feels like she is passing out.  She took it last night bed last time and feels like she passed out during her sleep.  She continues to have anxiety when she does not take the medication.  She reports taking her Prozac.  She states that her primary care doctors 1 that prescribes this. She she denies any desire to harm herself and denies suicidality or homicidality or psychosis.  She denies alcohol use.  She states that she did smoke marijuana daily until about a week ago but has not been smoking since she began taking the Xanax.    Home Medications Prior to Admission medications   Medication Sig Start Date End Date Taking? Authorizing Provider  potassium chloride SA (KLOR-CON M) 20 MEQ tablet Take 1 tablet (20 mEq total) by mouth 2 (two) times daily. 12/02/22  Yes Margarita Grizzle, MD  albuterol (PROVENTIL HFA;VENTOLIN HFA) 108 (90 BASE) MCG/ACT inhaler Inhale 2 puffs into the lungs every 4 (four) hours as needed for wheezing or shortness of breath. 02/05/14   Muthersbaugh, Dahlia Client, PA-C  azithromycin (ZITHROMAX) 250 MG tablet Take 1 tablet (250 mg total) by mouth daily. Take first 2 tablets together, then 1 every day until finished. 02/05/14   Muthersbaugh, Dahlia Client, PA-C  docusate sodium (COLACE) 100 MG capsule Take 1 capsule (100 mg total) by mouth 2 (two) times daily as needed. 06/28/13   Leftwich-Kirby, Wilmer Floor, CNM  HYDROcodone-acetaminophen (HYCET) 7.5-325 mg/15 ml solution Take 15 mLs by mouth every 6 (six) hours  as needed for moderate pain or severe pain (For sore throat). 04/09/14   Antony Madura, PA-C  hydrocortisone-pramoxine (PROCTOFOAM HC) rectal foam Place 1 applicator rectally 2 (two) times daily. Patient not taking: No sig reported 07/02/13   Carrington Clamp, MD  lidocaine [facility]) 3 % CREA cream Apply 1 application topically as needed (hemorrhoids).    [provider]  naproxen (NAPROSYN) 500 MG tablet Take 1 tablet (500 mg total) by mouth 2 (two) times daily. 04/09/14   Antony Madura, PA-C  Prenatal Vit-Fe Fumarate-FA (PRENATAL MULTIVITAMIN) TABS tablet Take 1 tablet by mouth daily at 12 noon.    [provider]      Allergies    Patient has no known allergies.    Review of Systems   Review of Systems  Physical Exam Updated Vital Signs BP 135/88   Pulse 76   Temp 98.1 F (36.7 C) (Oral)   Resp 17   SpO2 100%  Physical Exam Vitals reviewed.     ED Results / Procedures / Treatments   Labs (all labs ordered are listed, but only abnormal results are displayed) Labs Reviewed  URINALYSIS, ROUTINE W REFLEX MICROSCOPIC - Abnormal; Notable for the following components:      Result Value   Color, Urine RED (*)    Specific Gravity, Urine 1.002 (*)    Hgb urine dipstick MODERATE (*)    Protein, ur 100 (*)    Leukocytes,Ua TRACE (*)  Bacteria, UA FEW (*)    All other components within normal limits  I-STAT CHEM 8, ED - Abnormal; Notable for the following components:   Potassium 3.0 (*)    BUN <3 (*)    Glucose, Bld 102 (*)    All other components within normal limits  RAPID URINE DRUG SCREEN, HOSP PERFORMED    EKG EKG Interpretation Date/Time:  Friday December 02 2022 08:31:04 EDT Ventricular Rate:  76 PR Interval:  115 QRS Duration:  84 QT Interval:  412 QTC Calculation: 464 R Axis:   80  Text Interpretation: Sinus rhythm Atrial premature complex Borderline short PR interval Confirmed by Margarita Grizzle [phone]) on 12/02/2022 8:52:48  AM  Radiology No results found.  Procedures Procedures    Medications Ordered in ED Medications  potassium chloride (KLOR-CON) packet 40 mEq (has no administration in time range)    ED Course/ Medical Decision Making/ A&P Clinical Course as of 12/02/22 1210  Fri Dec 02, 2022  1206 Urine drug screen repeated reviewed and interpreted and negative I-STAT 8 significant for potassium low at 3.0 otherwise within normal limits Urinalysis reviewed interpreted significant for 0-5 red blood cells 0-5 white blood cells bacteria and squamous epithelial cells likely contaminated secondary to patient menstruating. [DR]    Clinical Course User Index [DR] Margarita Grizzle, MD                                 Medical Decision Making Amount and/or Complexity of Data Reviewed Labs: ordered.  Risk Prescription drug management.   42 year old female seen and evaluated for generalized weakness.  She feels this is a side effect of her new anxiety medication which is Xanax.  She has been having some symptoms of generalized weakness and feeling like she is passing out in her sleep. Differential diagnosis includes but is not limited to medication reaction, electrolyte abnormalities, Patient with hypokalemia here.  She is being orally repleted.  She reports poor oral intake due to feeling weak. Blood pressure and heart rate are normal Neurological exam is without focal neurological deficits I suspect that her weakness is secondary to her hypokalemia and possibly her new benzodiazepine use. She is having her potassium orally repleted here and will be given a prescription for potassium. She also feels like she is having some vaginal irritation that she identifies as being similar to prior episodes of yeast infection.  No yeast are noted in her urine.  Exam deferred due to second day of menstrual cycle.  She is advised to have recheck with her primary care doctor next week after her menstrual cycle has  ended. Patient appears to be stable for discharge.        Final Clinical Impression(s) / ED Diagnoses Final diagnoses:  Medication side effect  Hypokalemia    Rx / DC Orders ED Discharge Orders          Ordered    potassium chloride SA (KLOR-CON M) 20 MEQ tablet  2 times daily        12/02/22 1210              Margarita Grizzle, MD 12/02/22 1210

## 2022-12-02 NOTE — ED Notes (Signed)
Pt is refusing orthostatics at this time

## 2022-12-02 NOTE — ED Notes (Signed)
Pt given apple juice to drink

## 2022-12-02 NOTE — ED Triage Notes (Signed)
Patient BIB EMS today from home with complaints of weakness and anxiety and states that she feels like she is going to pass out. She states that she was unable to walk or to get up at all with EMS. She states that she started a new anxiety medication and that it is not working. She states that the medication is Xanax. Unsure of dosage.   When she was taken to the room in the ED she automatically stood up and was able to ambulate with no issues complaining of needing to use the restroom.  Vitals  146/92 88 HR 22 RR 99% on RA CBG 127

## 2022-12-02 NOTE — Discharge Instructions (Addendum)
Please take potassium as prescribed Eat a potassium rich diet Recheck of your potassium with your doctor next Monday or Tuesday. If you are continuing to have any vaginal irritation, please ask your doctor to evaluate at that time Return to the emergency department if you are having any worsening symptoms.

## 2023-12-07 ENCOUNTER — Encounter: Payer: Self-pay | Admitting: Physician Assistant

## 2024-01-26 NOTE — Progress Notes (Signed)
 01/29/2024 Joyce Mcclure [id number] 12/31/1980  Referring provider: Omar Cumins, FNP Primary GI doctor: Dr. Suzann  ASSESSMENT AND PLAN:  Rectal pain x child birth Rectal pain with Bm's, rectal bleeding with hard stools, worsening 6-12 months BM every other day, works on diet to keep stool soft  Increase gas, weight loss Small left posterior anal fissure causing significant pain. Possible ulcerative colitis due to symptoms and weight loss, unable to do good rectal exam due to pain. - Prescribed lidocaine  and diltiazem cream. - Recommended squatty potty. - Ordered colonoscopy to evaluate for ulcerative colitis, We have discussed the risks of bleeding, infection, perforation, medication reactions, and remote risk of death associated with colonoscopy. All questions were answered and the patient acknowledges these risk and wishes to proceed.. - Provided fissure management information.  Epigastric AB pain, history of H pylori as teenager Increase gas, weight loss States had when she was a teenager -Will check H pylori stool -Schedule EGD with colonoscopy to evaluate, check for metaplasia, gastritis, H pylori, etc I discussed risks of EGD with patient today, including risk of sedation, bleeding or perforation.  Patient provides understanding and gave verbal consent to proceed. - check labs to evaluate for infection, inflammation, celiac.  History of elevated alk phos During pregnancy 2010    Latest Ref Rng & Units 11/30/2022    2:13 PM 06/01/2008    5:07 AM 05/31/2008    5:35 AM  Hepatic Function  Total Protein 6.5 - 8.1 g/dL 7.5  4.8  5.7   Albumin 3.5 - 5.0 g/dL 4.5  2.2  2.6   AST 15 - 41 U/L 22  32  22   ALT 0 - 44 U/L 17  14  13    Alk Phosphatase 38 - 126 U/L 47  565  753   Total Bilirubin 0.3 - 1.2 mg/dL 1.3  0.8  0.6    Platelets 229  Recheck labs Consider AB US   Patient Care Team: Omar Cumins, FNP as PCP - General (Pain Medicine)  HISTORY OF PRESENT  ILLNESS: 43 y.o. female with a past medical history listed below presents for evaluation of rectal pain, bleeding, weight loss, nausea, epigastric pain.   Discussed the use of AI scribe software for clinical note transcription with the patient, who gave verbal consent to proceed.  History of Present Illness   Joyce Mcclure is a 43 year old female who presents with hemorrhoids and concerns about H. pylori infection.  She has experienced hemorrhoids for approximately ten years, with significant worsening over the past six to twelve months. She describes severe rectal pain during bowel movements, likening the sensation to having a 'butt plug in.' Pain occurs with every bowel movement, prompting dietary changes to avoid hard stools. Occasional bleeding is noted with hard stools, with blood on the tissue but no significant hemorrhaging. She has attempted dietary modifications and used witch hazel without relief.  She is concerned about a possible H. pylori infection, having been treated for it as a teenager with antibiotics. She experiences crampy, gassy upper abdominal pain, primarily at night, with a severity of five out of ten. Occasional nausea and heartburn are present, but she denies trouble swallowing.  Her bowel habits are influenced by hemorrhoids, leading to dietary adjustments to maintain softer stools. She has a bowel movement approximately every day, but the pain associated with defecation causes anxiety around eating and using the bathroom. She has experienced unintentional weight loss and is trying to gain weight.  No family history of liver issues, gallbladder problems, colon cancer, or autoimmune diseases. She reports significant anxiety, which she feels is interconnected with her gastrointestinal symptoms.        She  reports that she has been smoking cigarettes. She has never used smokeless tobacco. She reports that she does not drink alcohol and does not use drugs.  RELEVANT GI  HISTORY, IMAGING AND LABS: Results   LABS Liver function tests: Normal (2024)  DIAGNOSTIC Rectal examination: Internal hemorrhoids, anal fissure on left posterior rectal mucosa, no external hemorrhoids (01/29/2024)      CBC    Component Value Date/Time   WBC 8.4 11/30/2022 1413   RBC 4.46 11/30/2022 1413   HGB 12.9 12/02/2022 1037   HCT 38.0 12/02/2022 1037   PLT 229 11/30/2022 1413   MCV 91.3 11/30/2022 1413   MCH 31.8 11/30/2022 1413   MCHC 34.9 11/30/2022 1413   RDW 12.5 11/30/2022 1413   LYMPHSABS 2.2 11/30/2022 1413   MONOABS 0.7 11/30/2022 1413   EOSABS 0.0 11/30/2022 1413   BASOSABS 0.0 11/30/2022 1413   No results for input(s): HGB in the last 8760 hours.  CMP     Component Value Date/Time   NA 138 12/02/2022 1037   K 3.0 (L) 12/02/2022 1037   CL 101 12/02/2022 1037   CO2 22 11/30/2022 1413   GLUCOSE 102 (H) 12/02/2022 1037   BUN <3 (L) 12/02/2022 1037   CREATININE 0.60 12/02/2022 1037   CALCIUM 9.8 11/30/2022 1413   PROT 7.5 11/30/2022 1413   ALBUMIN 4.5 11/30/2022 1413   AST 22 11/30/2022 1413   ALT 17 11/30/2022 1413   ALKPHOS 47 11/30/2022 1413   BILITOT 1.3 (H) 11/30/2022 1413   GFRNONAA >60 11/30/2022 1413   GFRAA  06/01/2008 0507    >60        The eGFR has been calculated using the MDRD equation. This calculation has not been validated in all clinical situations. eGFR's persistently <60 mL/min signify possible Chronic Kidney Disease.      Latest Ref Rng & Units 11/30/2022    2:13 PM 06/01/2008    5:07 AM 05/31/2008    5:35 AM  Hepatic Function  Total Protein 6.5 - 8.1 g/dL 7.5  4.8  5.7   Albumin 3.5 - 5.0 g/dL 4.5  2.2  2.6   AST 15 - 41 U/L 22  32  22   ALT 0 - 44 U/L 17  14  13    Alk Phosphatase 38 - 126 U/L 47  565  753   Total Bilirubin 0.3 - 1.2 mg/dL 1.3  0.8  0.6       Current Medications:      Current Outpatient Medications (Analgesics):    acetaminophen  (TYLENOL ) 500 MG tablet, Take 500-1,000 mg by mouth See admin  instructions. Take 500-1000mg  (1-2 tablets) by mouth once as needed for pain. (Patient not taking: Reported on 01/29/2024)   Current Outpatient Medications (Other):    ALPRAZolam (XANAX) 0.25 MG tablet, Take 0.25 mg by mouth See admin instructions. Take 0.25mg  (1 tablet) by mouth every 12 hours as needed for anxiety. (Patient taking differently: Take 0.25 mg by mouth See admin instructions. Take 0.25mg  (1 tablet) by mouth every 12 hours as needed for anxiety. As needed)   AMBULATORY NON FORMULARY MEDICATION, Medication Name: Diltiazem 2%/Lidocaine  2% Using your index finger apply a small amount of medication inside the anal opening and to the external anal area twice daily x 6 weeks.  cholecalciferol (VITAMIN D3) 25 MCG (1000 UNIT) tablet, Take 1,000 Units by mouth daily.   hydrocortisone  (ANUSOL -HC) 2.5 % rectal cream, Place 1 Application rectally 2 (two) times daily.   Na Sulfate-K Sulfate-Mg Sulfate concentrate (SUPREP BOWEL PREP KIT) 17.5-3.13-1.6 GM/177ML SOLN, Take 1 kit (354 mLs total) by mouth as directed. For colonoscopy prep   potassium chloride  SA (KLOR-CON  M) 20 MEQ tablet, Take 1 tablet (20 mEq total) by mouth 2 (two) times daily. (Patient not taking: Reported on 01/29/2024)   sertraline (ZOLOFT) 25 MG tablet, Take 25 mg by mouth daily. (Patient not taking: Reported on 01/29/2024)   topiramate (TOPAMAX) 200 MG tablet, Take 200 mg by mouth at bedtime. (Patient not taking: Reported on 01/29/2024)  Medical History:  Past Medical History:  Diagnosis Date   Anxiety    Hemorrhoids    Allergies: No Known Allergies   Surgical History:  She  has a past surgical history that includes No past surgeries. Family History:  Her family history is not on file.  REVIEW OF SYSTEMS  : All other systems reviewed and negative except where noted in the History of Present Illness.  PHYSICAL EXAM: BP 96/62   Pulse 100   Ht 5' 5 (1.651 m)   Wt 117 lb (53.1 kg)   BMI 19.47 kg/m  Physical Exam    MEASUREMENTS: Weight- 122. GENERAL APPEARANCE: Well nourished, in no apparent distress. HEENT: No cervical lymphadenopathy, unremarkable thyroid, sclerae anicteric, conjunctiva pink. RESPIRATORY: Respiratory effort normal, breath sounds equal bilaterally without rales, rhonchi, or wheezing. CARDIO: Regular rate and rhythm with no murmurs, rubs, or gallops, peripheral pulses intact. ABDOMEN: Soft, non-distended, active bowel sounds in all four quadrants, no tenderness to palpation, no rebound, no mass appreciated. RECTAL: Hemorrhoidal skin tags present, no external hemorrhoids. Anal fissure present on the left side of rectal mucosa. Positive for blood, likely from hemorrhoids. MUSCULOSKELETAL: Full range of motion, normal gait, without edema. SKIN: Dry, intact without rashes or lesions. No jaundice. NEURO: Alert, oriented, no focal deficits. PSYCH: Cooperative, normal mood and affect.      Alan JONELLE Coombs, PA-C 11:42 AM

## 2024-01-29 ENCOUNTER — Ambulatory Visit: Admitting: Physician Assistant

## 2024-01-29 ENCOUNTER — Other Ambulatory Visit (INDEPENDENT_AMBULATORY_CARE_PROVIDER_SITE_OTHER)

## 2024-01-29 ENCOUNTER — Encounter: Payer: Self-pay | Admitting: Physician Assistant

## 2024-01-29 VITALS — BP 96/62 | HR 100 | Ht 65.0 in | Wt 117.0 lb

## 2024-01-29 DIAGNOSIS — Z8619 Personal history of other infectious and parasitic diseases: Secondary | ICD-10-CM | POA: Diagnosis not present

## 2024-01-29 DIAGNOSIS — R195 Other fecal abnormalities: Secondary | ICD-10-CM

## 2024-01-29 DIAGNOSIS — K602 Anal fissure, unspecified: Secondary | ICD-10-CM

## 2024-01-29 DIAGNOSIS — K6289 Other specified diseases of anus and rectum: Secondary | ICD-10-CM

## 2024-01-29 DIAGNOSIS — R110 Nausea: Secondary | ICD-10-CM

## 2024-01-29 DIAGNOSIS — R1013 Epigastric pain: Secondary | ICD-10-CM

## 2024-01-29 LAB — CBC WITH DIFFERENTIAL/PLATELET
Basophils Absolute: 0 K/uL (ref 0.0–0.1)
Basophils Relative: 0.5 % (ref 0.0–3.0)
Eosinophils Absolute: 0.1 K/uL (ref 0.0–0.7)
Eosinophils Relative: 2.2 % (ref 0.0–5.0)
HCT: 44.8 % (ref 36.0–46.0)
Hemoglobin: 15.4 g/dL — ABNORMAL HIGH (ref 12.0–15.0)
Lymphocytes Relative: 29 % (ref 12.0–46.0)
Lymphs Abs: 1.8 K/uL (ref 0.7–4.0)
MCHC: 34.4 g/dL (ref 30.0–36.0)
MCV: 98.3 fl (ref 78.0–100.0)
Monocytes Absolute: 0.6 K/uL (ref 0.1–1.0)
Monocytes Relative: 9.5 % (ref 3.0–12.0)
Neutro Abs: 3.6 K/uL (ref 1.4–7.7)
Neutrophils Relative %: 58.8 % (ref 43.0–77.0)
Platelets: 219 K/uL (ref 150.0–400.0)
RBC: 4.55 Mil/uL (ref 3.87–5.11)
RDW: 16 % — ABNORMAL HIGH (ref 11.5–15.5)
WBC: 6 K/uL (ref 4.0–10.5)

## 2024-01-29 LAB — COMPREHENSIVE METABOLIC PANEL WITH GFR
ALT: 10 U/L (ref 0–35)
AST: 13 U/L (ref 0–37)
Albumin: 4.5 g/dL (ref 3.5–5.2)
Alkaline Phosphatase: 52 U/L (ref 39–117)
BUN: 7 mg/dL (ref 6–23)
CO2: 24 meq/L (ref 19–32)
Calcium: 9.2 mg/dL (ref 8.4–10.5)
Chloride: 101 meq/L (ref 96–112)
Creatinine, Ser: 0.75 mg/dL (ref 0.40–1.20)
GFR: 99.07 mL/min (ref >60.00)
Glucose, Bld: 95 mg/dL (ref 70–99)
Potassium: 3.5 meq/L (ref 3.5–5.1)
Sodium: 136 meq/L (ref 135–145)
Total Bilirubin: 0.6 mg/dL (ref 0.2–1.2)
Total Protein: 6.8 g/dL (ref 6.0–8.3)

## 2024-01-29 LAB — TSH: TSH: 0.41 u[IU]/mL (ref 0.35–5.50)

## 2024-01-29 LAB — C-REACTIVE PROTEIN: CRP: <0.5 mg/dL (ref 0.5–20.0)

## 2024-01-29 MED ORDER — HYDROCORTISONE (PERIANAL) 2.5 % EX CREA: 1.0000 | TOPICAL_CREAM | Freq: Two times a day (BID) | CUTANEOUS | 2 refills | Status: AC

## 2024-01-29 MED ORDER — AMBULATORY NON FORMULARY MEDICATION: 1 refills | Status: AC

## 2024-01-29 MED ORDER — NA SULFATE-K SULFATE-MG SULF 17.5-3.13-1.6 GM/177ML PO SOLN: 1.0000 | ORAL | 0 refills | Status: DC

## 2024-01-29 NOTE — Patient Instructions (Addendum)
 Your provider has requested that you go to the basement level for lab work before leaving today. Press B on the elevator. The lab is located at the first door on the left as you exit the elevator.  Miralax is an osmotic laxative.  It only brings more water into the stool.  This is safe to take daily.  Can take up to 17 gram of miralax twice a day.  Mix with juice or coffee.  Start 1 capful at night for 3-4 days and reassess your response in 3-4 days.  You can increase and decrease the dose based on your response.  Remember, it can take up to 3-4 days to take effect OR for the effects to wear off.   I often pair this with benefiber in the morning to help assure the stool is not too loose.   FIBER SUPPLEMENT You can do metamucil or fibercon once or twice a day but if this causes gas/bloating please switch to Benefiber or Citracel.  Fiber is good for constipation/diarrhea/irritable bowel syndrome.  It can also help with weight loss and can help lower your bad cholesterol (LDL).  Please do 1 TBSP in the morning in water, coffee, or tea.  It can take up to a month before you can see a difference with your bowel movements.  It is cheapest from [employer], [employer], [employer].   Anal Fissure, Adult  A fissure is a linear defect in the anal mucosa, symptoms include burning, itching, discomfort especially with a bowel movement with associated rectal bleeding.  Risk factors include low fiber diet, chronic constipation and straining. Anal fissures can take a very long time to heal so this will be a 2 to 37-month process.  Treatment for a fissure includes:  -decreasing time in the toilet should not be more than 5 minutes -adding fiber supplement such as Benefiber or Citrucel -increasing water. -There is a lubricating suppository over-the-counter called Calmol-4 you can get from [place] or from your pharmacy (may have to order) that I want to do twice daily morning and evening for 8 weeks.  This is a 60 to  80% success rate. -I am also going to send in a calcium channel blocker cream to a compound pharmacy, apply twice daily for 12 weeks. If after 3 months this is not helpful then we will we will refer you to general surgery for evaluation, they can do Botox injections under anesthesia or surgery.  Diltiazem/lidocaine  3 x daily for 2 months sent to compound pharmacy  NTG 1.25% use gloved hand, apply pea size amount every 6-8 hours for pain rectally, #30 gram no refills.    Sent this medication to a compound pharmacy:  [facility] [address] [place], [place], [place] [address]  [phone]  We have sent the following medications to your pharmacy for you to pick up at your convenience: Hydrocortisone  , Suprep ( both sent to [facility])   Please DO NOT go directly from our office to pick up this medication! Give the pharmacy 1 day to process the prescription. Extra time is required for them to compound your medication.  Toileting tips to help with your constipation - Drink at least 64-80 ounces of water/liquid per day. - Establish a time to try to move your bowels every day.  For many people, this is after a cup of coffee or after a meal such as breakfast. - Sit all of the way back on the toilet keeping your back fairly straight and while  sitting up, try to rest the tops of your forearms on your upper thighs.   - Raising your feet with a step stool/squatty potty can be helpful to improve the angle that allows your stool to pass through the rectum. - Relax the rectum feeling it bulge toward the toilet water.  If you feel your rectum raising toward your body, you are contracting rather than relaxing. - Breathe in and slowly exhale. Belly breath by expanding your belly towards your belly button. Keep belly expanded as you gently direct pressure down and back to the anus.  A low pitched GRRR sound can assist with increasing intra-abdominal pressure.  (Can also trying to blow on a  pinwheel and make it move, this helps with the same belly breathing) - Repeat 3-4 times. If unsuccessful, contract the pelvic floor to restore normal tone and get off the toilet.  Avoid excessive straining. - To reduce excessive wiping by teaching your anus to normally contract, place hands on outer aspect of knees and resist knee movement outward.  Hold 5-10 second then place hands just inside of knees and resist inward movement of knees.  Hold 5 seconds.  Repeat a few times each way.  Go to the ER if unable to pass gas, severe AB pain, unable to hold down food, any shortness of breath of chest pain.

## 2024-01-30 ENCOUNTER — Ambulatory Visit: Payer: Self-pay | Admitting: Physician Assistant

## 2024-01-30 LAB — IGA: Immunoglobulin A: 215 mg/dL (ref 47–310)

## 2024-01-30 LAB — TISSUE TRANSGLUTAMINASE, IGA: (tTG) Ab, IgA: <1 U/mL

## 2024-01-30 LAB — SEDIMENTATION RATE: Sed Rate: 4 mm/h (ref 0–20)

## 2024-02-20 NOTE — Progress Notes (Unsigned)
 [facility] History and Physical   Primary Care Physician:  Omar Cumins, FNP   Reason for Procedure:  Epigastric abdominal pain, gas, weight loss, history of H. pylori infection, rectal pain, anal fissure, rectal bleeding  Plan:    Upper endoscopy and colonoscopy   The patient was provided an opportunity to ask questions and all were answered. The patient agreed with the plan.   HPI: Joyce Mcclure is a 43 y.o. female undergoing upper endoscopy and colonoscopy for investigation of epigastric abdominal pain, gas, weight loss, history of H. pylori infection, rectal pain, anal fissure and rectal bleeding.  Patient reports being diagnosed with H. pylori infection as a teenager and being treated with antibiotics.  In [place] is crampy, gassy upper abdominal pain primarily at night.  Occasional nausea and heartburn.  No vomiting.  Also endorses rectal pain and bleeding associated with passage of hard bowel movements.  Anal fissure noted on physical examination.  No family history of colorectal cancer.   Past Medical History:  Diagnosis Date   Anxiety    Hemorrhoids     Past Surgical History:  Procedure Laterality Date   NO PAST SURGERIES      Prior to Admission medications   Medication Sig Start Date End Date Taking? Authorizing Provider  acetaminophen  (TYLENOL ) 500 MG tablet Take 500-1,000 mg by mouth See admin instructions. Take 500-1000mg  (1-2 tablets) by mouth once as needed for pain. Patient not taking: Reported on 01/29/2024    [provider]  ALPRAZolam (XANAX) 0.25 MG tablet Take 0.25 mg by mouth See admin instructions. Take 0.25mg  (1 tablet) by mouth every 12 hours as needed for anxiety. Patient taking differently: Take 0.25 mg by mouth See admin instructions. Take 0.25mg  (1 tablet) by mouth every 12 hours as needed for anxiety. As needed 11/23/22   [provider]  AMBULATORY NON FORMULARY MEDICATION Medication Name: Diltiazem 2%/Lidocaine   2% Using your index finger apply a small amount of medication inside the anal opening and to the external anal area twice daily x 6 weeks. 01/29/24   Craig Alan SAUNDERS, PA-C  cholecalciferol (VITAMIN D3) 25 MCG (1000 UNIT) tablet Take 1,000 Units by mouth daily.    [provider]  hydrocortisone  (ANUSOL -HC) 2.5 % rectal cream Place 1 Application rectally 2 (two) times daily. 01/29/24   Craig Alan R, PA-C  Na Sulfate-K Sulfate-Mg Sulfate concentrate (SUPREP BOWEL PREP KIT) 17.5-3.13-1.6 GM/177ML SOLN Take 1 kit (354 mLs total) by mouth as directed. For colonoscopy prep 01/29/24   Craig Alan SAUNDERS, PA-C  potassium chloride  SA (KLOR-CON  M) 20 MEQ tablet Take 1 tablet (20 mEq total) by mouth 2 (two) times daily. Patient not taking: Reported on 01/29/2024 12/02/22   Ray, Danielle, MD  sertraline (ZOLOFT) 25 MG tablet Take 25 mg by mouth daily. Patient not taking: Reported on 01/29/2024 11/24/22   [provider]  topiramate (TOPAMAX) 200 MG tablet Take 200 mg by mouth at bedtime. Patient not taking: Reported on 01/29/2024 11/09/22   [provider]    Current Outpatient Medications  Medication Sig Dispense Refill   acetaminophen  (TYLENOL ) 500 MG tablet Take 500-1,000 mg by mouth See admin instructions. Take 500-1000mg  (1-2 tablets) by mouth once as needed for pain. (Patient not taking: Reported on 01/29/2024)     ALPRAZolam (XANAX) 0.25 MG tablet Take 0.25 mg by mouth See admin instructions. Take 0.25mg  (1 tablet) by mouth every 12 hours as needed for anxiety. (Patient taking differently: Take 0.25 mg by mouth See admin  instructions. Take 0.25mg  (1 tablet) by mouth every 12 hours as needed for anxiety. As needed)     AMBULATORY NON FORMULARY MEDICATION Medication Name: Diltiazem 2%/Lidocaine  2% Using your index finger apply a small amount of medication inside the anal opening and to the external anal area twice daily x 6 weeks. 30 g 1   cholecalciferol (VITAMIN D3) 25 MCG  (1000 UNIT) tablet Take 1,000 Units by mouth daily.     hydrocortisone  (ANUSOL -HC) 2.5 % rectal cream Place 1 Application rectally 2 (two) times daily. 30 g 2   Na Sulfate-K Sulfate-Mg Sulfate concentrate (SUPREP BOWEL PREP KIT) 17.5-3.13-1.6 GM/177ML SOLN Take 1 kit (354 mLs total) by mouth as directed. For colonoscopy prep 354 mL 0   potassium chloride  SA (KLOR-CON  M) 20 MEQ tablet Take 1 tablet (20 mEq total) by mouth 2 (two) times daily. (Patient not taking: Reported on 01/29/2024) 10 tablet 0   sertraline (ZOLOFT) 25 MG tablet Take 25 mg by mouth daily. (Patient not taking: Reported on 01/29/2024)     topiramate (TOPAMAX) 200 MG tablet Take 200 mg by mouth at bedtime. (Patient not taking: Reported on 01/29/2024)     No current facility-administered medications for this visit.    Allergies as of 02/22/2024   (No Known Allergies)    No family history on file.  Social History   Socioeconomic History   Marital status: Single    Spouse name: Not on file   Number of children: 2   Years of education: Not on file   Highest education level: Not on file  Occupational History   Occupation: home maker  Tobacco Use   Smoking status: Every Day    Current packs/day: 0.25    Types: Cigarettes   Smokeless tobacco: Never  Vaping Use   Vaping status: Never Used  Substance and Sexual Activity   Alcohol use: No   Drug use: No   Sexual activity: Yes  Other Topics Concern   Not on file  Social History Narrative   Not on file   Social Drivers of Health   Financial Resource Strain: Not on file  Food Insecurity: Not on file  Transportation Needs: Not on file  Physical Activity: Not on file  Stress: Not on file  Social Connections: Unknown (07/24/2021)   Received from [facility]   Social Network    Social Network: Not on file  Intimate Partner Violence: Unknown (06/16/2021)   Received from [facility]   HITS    Physically Hurt: Not on file    Insult or Talk Down To: Not on file     Threaten Physical Harm: Not on file    Scream or Curse: Not on file    Review of Systems:  All other review of systems negative except as mentioned in the HPI.  Physical Exam: Vital signs There were no vitals taken for this visit.  General:   Alert,  Well-developed, well-nourished, pleasant and cooperative in NAD Airway:  Mallampati  Lungs:  Clear throughout to auscultation.   Heart:  Regular rate and rhythm; no murmurs, clicks, rubs,  or gallops. Abdomen:  Soft, nontender and nondistended. Normal bowel sounds.   Neuro/Psych:  Normal mood and affect. A and O x 3  Inocente Hausen, MD [facility]

## 2024-02-22 ENCOUNTER — Ambulatory Visit: Admitting: Pediatrics

## 2024-02-22 ENCOUNTER — Encounter: Admitting: Pediatrics

## 2024-02-22 ENCOUNTER — Encounter: Payer: Self-pay | Admitting: Pediatrics

## 2024-02-22 VITALS — BP 119/74 | HR 96 | Temp 97.0°F | Resp 13 | Ht 65.0 in | Wt 117.0 lb

## 2024-02-22 DIAGNOSIS — R140 Abdominal distension (gaseous): Secondary | ICD-10-CM

## 2024-02-22 DIAGNOSIS — Z8619 Personal history of other infectious and parasitic diseases: Secondary | ICD-10-CM

## 2024-02-22 DIAGNOSIS — R1013 Epigastric pain: Secondary | ICD-10-CM

## 2024-02-22 DIAGNOSIS — K6289 Other specified diseases of anus and rectum: Secondary | ICD-10-CM

## 2024-02-22 DIAGNOSIS — K602 Anal fissure, unspecified: Secondary | ICD-10-CM

## 2024-02-22 DIAGNOSIS — R195 Other fecal abnormalities: Secondary | ICD-10-CM

## 2024-02-22 DIAGNOSIS — R634 Abnormal weight loss: Secondary | ICD-10-CM

## 2024-02-22 DIAGNOSIS — R110 Nausea: Secondary | ICD-10-CM

## 2024-02-22 MED ORDER — NA SULFATE-K SULFATE-MG SULF 17.5-3.13-1.6 GM/177ML PO SOLN: 1.0000 | Freq: Once | ORAL | 0 refills | Status: AC

## 2024-02-22 MED ORDER — SODIUM CHLORIDE 0.9 % IV SOLN: 500.0000 mL | INTRAVENOUS | Status: DC

## 2024-02-22 NOTE — Patient Instructions (Signed)
-  Continue current medications. -await for pathology. - instruction for colonoscopy provided. Bowel prep prescription sent to  [facility]. You will also need to purchase the follow over the counter medication for you colonoscopy: one 119 gram bottle of Miralax, one box of Dulcolax laxative 5 mg tablet and one 32oz Gatorade ( No red or purple)  YOU HAD AN ENDOSCOPIC PROCEDURE TODAY AT THE [facility]:   Refer to the procedure report that was given to you for any specific questions about what was found during the examination.  If the procedure report does not answer your questions, please call your gastroenterologist to clarify.  If you requested that your care partner not be given the details of your procedure findings, then the procedure report has been included in a sealed envelope for you to review at your convenience later.  YOU SHOULD EXPECT: Some feelings of bloating in the abdomen. Passage of more gas than usual.  Walking can help get rid of the air that was put into your GI tract during the procedure and reduce the bloating. If you had a lower endoscopy (such as a colonoscopy or flexible sigmoidoscopy) you may notice spotting of blood in your stool or on the toilet paper. If you underwent a bowel prep for your procedure, you may not have a normal bowel movement for a few days.  Please Note:  You might notice some irritation and congestion in your nose or some drainage.  This is from the oxygen used during your procedure.  There is no need for concern and it should clear up in a day or so.  SYMPTOMS TO REPORT IMMEDIATELY:  Following upper endoscopy (EGD)  Vomiting of blood or coffee ground material  New chest pain or pain under the shoulder blades  Painful or persistently difficult swallowing  New shortness of breath  Fever of 100F or higher  Black, tarry-looking stools  For urgent or emergent issues, a gastroenterologist can be reached at any hour by  calling [phone]. Do not use MyChart messaging for urgent concerns.    DIET:  We do recommend a small meal at first, but then you may proceed to your regular diet.  Drink plenty of fluids but you should avoid alcoholic beverages for 24 hours.  ACTIVITY:  You should plan to take it easy for the rest of today and you should NOT DRIVE or use heavy machinery until tomorrow (because of the sedation medicines used during the test).    FOLLOW UP: Our staff will call the number listed on your records the next business day following your procedure.  We will call around 7:15- 8:00 am to check on you and address any questions or concerns that you may have regarding the information given to you following your procedure. If we do not reach you, we will leave a message.     If any biopsies were taken you will be contacted by phone or by letter within the next 1-3 weeks.  Please call us  at [phone] if you have not heard about the biopsies in 3 weeks.    SIGNATURES/CONFIDENTIALITY: You and/or your care partner have signed paperwork which will be entered into your electronic medical record.  These signatures attest to the fact that that the information above on your After Visit Summary has been reviewed and is understood.  Full responsibility of the confidentiality of this discharge information lies with you and/or your care-partner.

## 2024-02-22 NOTE — Progress Notes (Signed)
 Rescheduled colonoscopy with 2 day prep for 02/27/24. Sent prescription for Suprep to pt's preferred pharmacy. Printed and reviewed instructions with pt, also sent to pt via MyChart. Order placed for ambulatory referral.

## 2024-02-22 NOTE — Progress Notes (Signed)
 Colonoscopy prep instructions given to patient and patient mother, Retia. Patient and patient mother, Retia verbalized understanding of instructions.

## 2024-02-22 NOTE — Progress Notes (Signed)
1518 Robinul 0.1 mg IV given due large amount of secretions upon assessment.  MD made aware, vss

## 2024-02-22 NOTE — Progress Notes (Signed)
 During pre assessment for procedures pt reported that her last stool was 36 hours, brown, and soft. Pt stated that she was unaware that she had to pick up her prep from the pharmacy.  This nurse reported to CRNA and MD who spoke with pt and recommended that she reschedule colonoscopy with a 2 day prep. Pt educated but insisted that she still wanted to have the upper endoscopy. Will reschedule colonoscopy for another date.

## 2024-02-22 NOTE — Progress Notes (Signed)
 Pt's states no medical or surgical changes since previsit or office visit.

## 2024-02-22 NOTE — Progress Notes (Signed)
 Report given to PACU, vss

## 2024-02-22 NOTE — Progress Notes (Signed)
1535 HR > 100 with esmolol 25 mg given IV, MD updated, vss

## 2024-02-23 ENCOUNTER — Telehealth: Payer: Self-pay

## 2024-02-23 NOTE — Telephone Encounter (Signed)
 No answer on follow-up call. Voice mail box not set up. Unable to leave message for pt.

## 2024-02-25 NOTE — Progress Notes (Deleted)
 [facility] History and Physical   Primary Care Physician:  Omar Cumins, FNP   Reason for Procedure:  Rectal pain, rectal bleeding, anal fissure, gas, constipation  Plan:    Colonoscopy   The patient was provided an opportunity to ask questions and all were answered. The patient agreed with the plan.   HPI: Joyce Mcclure is a 43 y.o. female undergoing colonoscopy for investigation of rectal pain, rectal bleeding, gas, anal fissure and constipation.  Patient endorses rectal pain with bowel movements for the last 6 to 12 months.  Noted to have small left posterior anal fissure on physical exam.  Also endorses unexplained weight loss.  No family history of colorectal cancer or polyps.  No family history of IBD.   Past Medical History:  Diagnosis Date   Anxiety    Hemorrhoids     Past Surgical History:  Procedure Laterality Date   NO PAST SURGERIES      Prior to Admission medications  Medication Sig Start Date End Date Taking? Authorizing Provider  acetaminophen  (TYLENOL ) 500 MG tablet Take 500-1,000 mg by mouth See admin instructions. Take 500-1000mg  (1-2 tablets) by mouth once as needed for pain. Patient not taking: No sig reported    [provider]  ALPRAZolam (XANAX) 0.25 MG tablet Take 0.25 mg by mouth See admin instructions. Take 0.25mg  (1 tablet) by mouth every 12 hours as needed for anxiety. Patient taking differently: Take 0.25 mg by mouth See admin instructions. Take 0.25mg  (1 tablet) by mouth every 12 hours as needed for anxiety. As needed 11/23/22   [provider]  AMBULATORY NON FORMULARY MEDICATION Medication Name: Diltiazem 2%/Lidocaine  2% Using your index finger apply a small amount of medication inside the anal opening and to the external anal area twice daily x 6 weeks. 01/29/24   Craig Alan SAUNDERS, PA-C  cholecalciferol (VITAMIN D3) 25 MCG (1000 UNIT) tablet Take 1,000 Units by mouth daily. Patient not taking: Reported on  02/22/2024    [provider]  hydrocortisone  (ANUSOL -HC) 2.5 % rectal cream Place 1 Application rectally 2 (two) times daily. 01/29/24   Craig Alan SAUNDERS, PA-C  potassium chloride  SA (KLOR-CON  M) 20 MEQ tablet Take 1 tablet (20 mEq total) by mouth 2 (two) times daily. Patient not taking: No sig reported 12/02/22   Levander Houston, MD  sertraline (ZOLOFT) 25 MG tablet Take 25 mg by mouth daily. Patient not taking: No sig reported 11/24/22   [provider]  topiramate (TOPAMAX) 200 MG tablet Take 200 mg by mouth at bedtime. Patient not taking: Reported on 12/02/2022 11/09/22   [provider]    Current Outpatient Medications  Medication Sig Dispense Refill   acetaminophen  (TYLENOL ) 500 MG tablet Take 500-1,000 mg by mouth See admin instructions. Take 500-1000mg  (1-2 tablets) by mouth once as needed for pain. (Patient not taking: No sig reported)     ALPRAZolam (XANAX) 0.25 MG tablet Take 0.25 mg by mouth See admin instructions. Take 0.25mg  (1 tablet) by mouth every 12 hours as needed for anxiety. (Patient taking differently: Take 0.25 mg by mouth See admin instructions. Take 0.25mg  (1 tablet) by mouth every 12 hours as needed for anxiety. As needed)     AMBULATORY NON FORMULARY MEDICATION Medication Name: Diltiazem 2%/Lidocaine  2% Using your index finger apply a small amount of medication inside the anal opening and to the external anal area twice daily x 6 weeks. 30 g 1   cholecalciferol (VITAMIN D3) 25 MCG (1000 UNIT) tablet Take 1,000  Units by mouth daily. (Patient not taking: Reported on 02/22/2024)     hydrocortisone  (ANUSOL -HC) 2.5 % rectal cream Place 1 Application rectally 2 (two) times daily. 30 g 2   potassium chloride  SA (KLOR-CON  M) 20 MEQ tablet Take 1 tablet (20 mEq total) by mouth 2 (two) times daily. (Patient not taking: No sig reported) 10 tablet 0   sertraline (ZOLOFT) 25 MG tablet Take 25 mg by mouth daily. (Patient not taking: No sig reported)      topiramate (TOPAMAX) 200 MG tablet Take 200 mg by mouth at bedtime. (Patient not taking: Reported on 12/02/2022)     No current facility-administered medications for this visit.    Allergies as of 02/27/2024   (No Known Allergies)    No family history on file.  Social History   Socioeconomic History   Marital status: Single    Spouse name: Not on file   Number of children: 2   Years of education: Not on file   Highest education level: Not on file  Occupational History   Occupation: home maker  Tobacco Use   Smoking status: Every Day    Current packs/day: 0.25    Types: Cigarettes   Smokeless tobacco: Never  Vaping Use   Vaping status: Never Used  Substance and Sexual Activity   Alcohol use: No   Drug use: Yes    Types: Marijuana   Sexual activity: Yes  Other Topics Concern   Not on file  Social History Narrative   Not on file   Social Drivers of Health   Tobacco Use: High Risk (02/22/2024)   Patient History    Smoking Tobacco Use: Every Day    Smokeless Tobacco Use: Never    Passive Exposure: Not on file  Financial Resource Strain: Not on file  Food Insecurity: Not on file  Transportation Needs: Not on file  Physical Activity: Not on file  Stress: Not on file  Social Connections: Unknown (07/24/2021)   Received from [facility]   Social Network    Social Network: Not on file  Intimate Partner Violence: Unknown (06/16/2021)   Received from [facility]   HITS    Physically Hurt: Not on file    Insult or Talk Down To: Not on file    Threaten Physical Harm: Not on file    Scream or Curse: Not on file  Depression (EYV7-0): Not on file  Alcohol Screen: Not on file  Housing: Not on file  Utilities: Not on file  Health Literacy: Not on file    Review of Systems:  All other review of systems negative except as mentioned in the HPI.  Physical Exam: Vital signs There were no vitals taken for this visit.  General:   Alert,  Well-developed,  well-nourished, pleasant and cooperative in NAD Airway:  Mallampati  Lungs:  Clear throughout to auscultation.   Heart:  Regular rate and rhythm; no murmurs, clicks, rubs,  or gallops. Abdomen:  Soft, nontender and nondistended. Normal bowel sounds.   Neuro/Psych:  Normal mood and affect. A and O x 3  Inocente Hausen, MD [facility]

## 2024-02-26 ENCOUNTER — Telehealth: Payer: Self-pay | Admitting: Pediatrics

## 2024-02-26 DIAGNOSIS — R195 Other fecal abnormalities: Secondary | ICD-10-CM

## 2024-02-26 MED ORDER — NA SULFATE-K SULFATE-MG SULF 17.5-3.13-1.6 GM/177ML PO SOLN: 1.0000 | Freq: Once | ORAL | 0 refills | Status: AC

## 2024-02-26 NOTE — Telephone Encounter (Signed)
 Good morning Dr. Suzann,   I received  a call from this patient stating that she has been unable to afford her prep medication. Patient was schedule for a colonoscopy on December the 16 th. Patient has been rescheduled for January the 26 th. Please advise.    Thank you.

## 2024-02-26 NOTE — Telephone Encounter (Signed)
 Spoke with pt. She states she has no money at this time, that is why she rescheduled.  I advised her to use [facility] when she picked up her prescription at [facility] the price is $34.85 with that coupon.  Pt states that price will be fine and she will be able to fill Suprep for procedure on 04-08-24.  New instructions sent via MyChart

## 2024-02-27 ENCOUNTER — Encounter: Admitting: Pediatrics

## 2024-02-27 LAB — SURGICAL PATHOLOGY

## 2024-02-27 NOTE — Op Note (Signed)
 [facility] Patient Name: Joyce Mcclure Procedure Date: 02/22/2024 3:30 PM MRN: [id number] Endoscopist: Inocente Hausen , MD, [id number] Age: 43 Referring MD:  Date of Birth: [birth year] Gender: Female Account #: [id number] Procedure:                Upper GI endoscopy Indications:              Epigastric abdominal pain, Heartburn, Follow-up of                            Helicobacter pylori, Abdominal bloating, Nausea,                            Weight loss Medicines:                Monitored Anesthesia Care Procedure:                Pre-Anesthesia Assessment:                           - Prior to the procedure, a History and Physical                            was performed, and patient medications and                            allergies were reviewed. The patient's tolerance of                            previous anesthesia was also reviewed. The risks                            and benefits of the procedure and the sedation                            options and risks were discussed with the patient.                            All questions were answered, and informed consent                            was obtained. Prior Anticoagulants: The patient has                            taken no anticoagulant or antiplatelet agents. ASA                            Grade Assessment: II - A patient with mild systemic                            disease. After reviewing the risks and benefits,                            the patient was deemed in satisfactory condition to  undergo the procedure.                           After obtaining informed consent, the endoscope was                            passed under direct vision. Throughout the                            procedure, the patient's blood pressure, pulse, and                            oxygen saturations were monitored continuously. The                            GIF [id number] #[id number] was introduced  through the                            mouth, and advanced to the second part of duodenum.                            The upper GI endoscopy was accomplished without                            difficulty. The patient tolerated the procedure                            well. Scope In: Scope Out: Findings:                 The examined esophagus was normal.                           The gastric body, gastric antrum, cardia (on                            retroflexion) and gastric fundus (on retroflexion)                            were normal. Biopsies were taken with a cold                            forceps for Helicobacter pylori testing.                           The duodenal bulb and second portion of the                            duodenum were normal. Biopsies for histology were                            taken with a cold forceps for evaluation of celiac                            disease. Complications:  No immediate complications. Estimated blood loss:                            Minimal. Estimated Blood Loss:     Estimated blood loss was minimal. Impression:               - Normal esophagus.                           - Normal gastric body, antrum, cardia and gastric                            fundus. Biopsied.                           - Normal duodenal bulb and second portion of the                            duodenum. Biopsied. Recommendation:           - Discharge patient to home (ambulatory).                           - Await pathology results.                           - Continue present medications.                           - The findings and recommendations were discussed                            with the patient's family.                           - Patient has a contact number available for                            emergencies. The signs and symptoms of potential                            delayed complications were discussed with the                             patient. Return to normal activities tomorrow.                            Written discharge instructions were provided to the                            patient. Inocente Hausen, MD 02/22/2024 3:43:53 PM This report has been signed electronically.

## 2024-02-28 ENCOUNTER — Ambulatory Visit: Payer: Self-pay | Admitting: Pediatrics

## 2024-03-14 ENCOUNTER — Emergency Department (HOSPITAL_COMMUNITY)
Admission: EM | Admit: 2024-03-14 | Discharge: 2024-03-15 | Discharge status: Against Medical Advice | Attending: Emergency Medicine | Admitting: Emergency Medicine

## 2024-03-14 ENCOUNTER — Other Ambulatory Visit: Payer: Self-pay

## 2024-03-14 DIAGNOSIS — Z5321 Procedure and treatment not carried out due to patient leaving prior to being seen by health care provider: Secondary | ICD-10-CM | POA: Insufficient documentation

## 2024-03-14 DIAGNOSIS — R1110 Vomiting, unspecified: Secondary | ICD-10-CM | POA: Diagnosis present

## 2024-03-14 LAB — URINALYSIS, ROUTINE W REFLEX MICROSCOPIC
Bilirubin Urine: NEGATIVE
Glucose, UA: NEGATIVE mg/dL
Hgb urine dipstick: NEGATIVE
Ketones, ur: NEGATIVE mg/dL
Nitrite: NEGATIVE
Protein, ur: NEGATIVE mg/dL
Specific Gravity, Urine: 1.02 (ref 1.005–1.030)
pH: 5 (ref 5.0–8.0)

## 2024-03-14 LAB — LIPASE, BLOOD: Lipase: 22 U/L (ref 11–51)

## 2024-03-14 LAB — CBC
HCT: 44.3 % (ref 36.0–46.0)
Hemoglobin: 15.4 g/dL — ABNORMAL HIGH (ref 12.0–15.0)
MCH: 34.8 pg — ABNORMAL HIGH (ref 26.0–34.0)
MCHC: 34.8 g/dL (ref 30.0–36.0)
MCV: 100.2 fL — ABNORMAL HIGH (ref 80.0–100.0)
Platelets: 240 K/uL (ref 150–400)
RBC: 4.42 MIL/uL (ref 3.87–5.11)
RDW: 13.6 % (ref 11.5–15.5)
WBC: 11.8 K/uL — ABNORMAL HIGH (ref 4.0–10.5)
nRBC: 0 % (ref 0.0–0.2)

## 2024-03-14 LAB — COMPREHENSIVE METABOLIC PANEL WITH GFR
ALT: 21 U/L (ref 0–44)
AST: 25 U/L (ref 15–41)
Albumin: 4.3 g/dL (ref 3.5–5.0)
Alkaline Phosphatase: 85 U/L (ref 38–126)
Anion gap: 10 (ref 5–15)
BUN: 8 mg/dL (ref 6–20)
CO2: 24 mmol/L (ref 22–32)
Calcium: 9.4 mg/dL (ref 8.9–10.3)
Chloride: 104 mmol/L (ref 98–111)
Creatinine, Ser: 0.86 mg/dL (ref 0.44–1.00)
GFR, Estimated: >60 mL/min
Glucose, Bld: 105 mg/dL — ABNORMAL HIGH (ref 70–99)
Potassium: 4.2 mmol/L (ref 3.5–5.1)
Sodium: 139 mmol/L (ref 135–145)
Total Bilirubin: 0.3 mg/dL (ref 0.0–1.2)
Total Protein: 6.8 g/dL (ref 6.5–8.1)

## 2024-03-14 LAB — HCG, SERUM, QUALITATIVE: Preg, Serum: NEGATIVE

## 2024-03-14 NOTE — ED Triage Notes (Signed)
 QTN: Patient states she vomited once with some blood after eating a salad.

## 2024-03-14 NOTE — ED Notes (Signed)
 Pt going to urgent care in the morning. Pt left the ED

## 2024-03-14 NOTE — ED Notes (Signed)
Pt reports sore throat. 

## 2024-03-26 ENCOUNTER — Other Ambulatory Visit (HOSPITAL_BASED_OUTPATIENT_CLINIC_OR_DEPARTMENT_OTHER): Payer: Self-pay | Admitting: Pain Medicine

## 2024-03-26 DIAGNOSIS — Z1231 Encounter for screening mammogram for malignant neoplasm of breast: Secondary | ICD-10-CM

## 2024-03-28 ENCOUNTER — Encounter: Payer: Self-pay | Admitting: Neurology

## 2024-03-28 ENCOUNTER — Encounter: Payer: Self-pay | Admitting: Pain Medicine

## 2024-03-28 ENCOUNTER — Other Ambulatory Visit: Payer: Self-pay | Admitting: Pain Medicine

## 2024-03-28 DIAGNOSIS — N63 Unspecified lump in unspecified breast: Secondary | ICD-10-CM

## 2024-04-05 ENCOUNTER — Telehealth: Payer: Self-pay

## 2024-04-05 NOTE — Telephone Encounter (Signed)
 Patient was notified of the clinic closing on Monday, and the procedure has been rescheduled.

## 2024-04-08 ENCOUNTER — Encounter: Admitting: Pediatrics

## 2024-04-10 NOTE — Progress Notes (Signed)
 [facility] History and Physical   Primary Care Physician:  Omar Cumins, FNP   Reason for Procedure:  Rectal pain and bleeding, gas, weight loss  Plan:    Colonoscopy   The patient was provided an opportunity to ask questions and all were answered. The patient agreed with the plan.   HPI: Joyce Mcclure is a 44 y.o. female undergoing colonoscopy for investigation of rectal pain and bleeding associated with defecation, gas and weight loss for 6 to 12 months.  At the time of recent clinic visit, patient reported symptoms of rectal pain as well as difficulty evacuating bowel movements.  Pain occurred with every bowel movement with intermittent rectal bleeding.  Reports a history of hemorrhoids.  On physical examination in clinic she had an anal fissure.  Past Medical History:  Diagnosis Date   Anxiety    Hemorrhoids     Past Surgical History:  Procedure Laterality Date   NO PAST SURGERIES      Prior to Admission medications  Medication Sig Start Date End Date Taking? Authorizing Provider  acetaminophen  (TYLENOL ) 500 MG tablet Take 500-1,000 mg by mouth See admin instructions. Take 500-1000mg  (1-2 tablets) by mouth once as needed for pain. Patient not taking: No sig reported    [provider]  ALPRAZolam (XANAX) 0.25 MG tablet Take 0.25 mg by mouth See admin instructions. Take 0.25mg  (1 tablet) by mouth every 12 hours as needed for anxiety. Patient taking differently: Take 0.25 mg by mouth See admin instructions. Take 0.25mg  (1 tablet) by mouth every 12 hours as needed for anxiety. As needed 11/23/22   [provider]  AMBULATORY NON FORMULARY MEDICATION Medication Name: Diltiazem 2%/Lidocaine  2% Using your index finger apply a small amount of medication inside the anal opening and to the external anal area twice daily x 6 weeks. 01/29/24   Craig Alan SAUNDERS, PA-C  cholecalciferol (VITAMIN D3) 25 MCG (1000 UNIT) tablet Take 1,000 Units by mouth  daily. Patient not taking: Reported on 02/22/2024    [provider]  hydrocortisone  (ANUSOL -HC) 2.5 % rectal cream Place 1 Application rectally 2 (two) times daily. 01/29/24   Craig Alan SAUNDERS, PA-C  potassium chloride  SA (KLOR-CON  M) 20 MEQ tablet Take 1 tablet (20 mEq total) by mouth 2 (two) times daily. Patient not taking: No sig reported 12/02/22   Levander Houston, MD  sertraline (ZOLOFT) 25 MG tablet Take 25 mg by mouth daily. Patient not taking: No sig reported 11/24/22   [provider]  topiramate (TOPAMAX) 200 MG tablet Take 200 mg by mouth at bedtime. Patient not taking: Reported on 12/02/2022 11/09/22   [provider]    Current Outpatient Medications  Medication Sig Dispense Refill   AMBULATORY NON FORMULARY MEDICATION Medication Name: Diltiazem 2%/Lidocaine  2% Using your index finger apply a small amount of medication inside the anal opening and to the external anal area twice daily x 6 weeks. 30 g 1   cholecalciferol (VITAMIN D3) 25 MCG (1000 UNIT) tablet Take 1,000 Units by mouth daily.     hydrOXYzine (ATARAX) 25 MG tablet Take 25 mg by mouth daily as needed.     acetaminophen  (TYLENOL ) 500 MG tablet Take 500-1,000 mg by mouth See admin instructions. Take 500-1000mg  (1-2 tablets) by mouth once as needed for pain. (Patient not taking: No sig reported)     ALPRAZolam (XANAX) 0.25 MG tablet Take 0.25 mg by mouth See admin instructions. Take 0.25mg  (1 tablet) by mouth every 12 hours as needed for  anxiety. (Patient taking differently: Take 0.25 mg by mouth See admin instructions. Take 0.25mg  (1 tablet) by mouth every 12 hours as needed for anxiety. As needed)     hydrocortisone  (ANUSOL -HC) 2.5 % rectal cream Place 1 Application rectally 2 (two) times daily. 30 g 2   potassium chloride  SA (KLOR-CON  M) 20 MEQ tablet Take 1 tablet (20 mEq total) by mouth 2 (two) times daily. (Patient not taking: Reported on 04/12/2024) 10 tablet 0   Current Facility-Administered  Medications  Medication Dose Route Frequency Provider Last Rate Last Admin   0.9 %  sodium chloride  infusion  500 mL Intravenous Once Miral Hoopes, Inocente HERO, MD        Allergies as of 04/12/2024   (No Known Allergies)    Family History  Problem Relation Age of Onset   Colon cancer Neg Hx    Esophageal cancer Neg Hx    Rectal cancer Neg Hx    Stomach cancer Neg Hx     Social History   Socioeconomic History   Marital status: Single    Spouse name: Not on file   Number of children: 2   Years of education: Not on file   Highest education level: Not on file  Occupational History   Occupation: home maker  Tobacco Use   Smoking status: Every Day    Current packs/day: 0.25    Types: Cigarettes   Smokeless tobacco: Never  Vaping Use   Vaping status: Never Used  Substance and Sexual Activity   Alcohol use: No   Drug use: Yes    Types: Marijuana    Comment: 04/11/24   Sexual activity: Yes  Other Topics Concern   Not on file  Social History Narrative   Not on file   Social Drivers of Health   Tobacco Use: High Risk (04/12/2024)   Patient History    Smoking Tobacco Use: Every Day    Smokeless Tobacco Use: Never    Passive Exposure: Not on file  Financial Resource Strain: Not on file  Food Insecurity: Not on file  Transportation Needs: Not on file  Physical Activity: Not on file  Stress: Not on file  Social Connections: Unknown (07/24/2021)   Received from [facility]   Social Network    Social Network: Not on file  Intimate Partner Violence: Unknown (06/16/2021)   Received from [facility]   HITS    Physically Hurt: Not on file    Insult or Talk Down To: Not on file    Threaten Physical Harm: Not on file    Scream or Curse: Not on file  Depression (EYV7-0): Not on file  Alcohol Screen: Not on file  Housing: Not on file  Utilities: Not on file  Health Literacy: Not on file    Review of Systems:  All other review of systems negative except as mentioned in the  HPI.  Physical Exam: Vital signs BP 107/80   Pulse 72   Temp 98.3 F (36.8 C)   Resp 10   Ht 5' 5 (1.651 m)   Wt 117 lb (53.1 kg)   LMP 03/28/2024   SpO2 100%   BMI 19.47 kg/m   General:   Alert,  Well-developed, well-nourished, pleasant and cooperative in NAD Airway:  Mallampati 1 Lungs:  Clear throughout to auscultation.   Heart:  Regular rate and rhythm; no murmurs, clicks, rubs,  or gallops. Abdomen:  Soft, nontender and nondistended. Normal bowel sounds.   Neuro/Psych:  Normal mood and affect. A  and O x 3  Inocente Hausen, MD [facility]

## 2024-04-11 ENCOUNTER — Encounter: Payer: Self-pay | Admitting: *Deleted

## 2024-04-12 ENCOUNTER — Encounter: Payer: Self-pay | Admitting: Pediatrics

## 2024-04-12 ENCOUNTER — Ambulatory Visit: Admitting: Pediatrics

## 2024-04-12 VITALS — BP 112/79 | HR 70 | Temp 98.3°F | Resp 18 | Ht 65.0 in | Wt 117.0 lb

## 2024-04-12 DIAGNOSIS — R143 Flatulence: Secondary | ICD-10-CM

## 2024-04-12 DIAGNOSIS — K648 Other hemorrhoids: Secondary | ICD-10-CM | POA: Diagnosis not present

## 2024-04-12 DIAGNOSIS — K625 Hemorrhage of anus and rectum: Secondary | ICD-10-CM | POA: Diagnosis not present

## 2024-04-12 DIAGNOSIS — K6289 Other specified diseases of anus and rectum: Secondary | ICD-10-CM | POA: Diagnosis not present

## 2024-04-12 DIAGNOSIS — R634 Abnormal weight loss: Secondary | ICD-10-CM

## 2024-04-12 MED ORDER — SODIUM CHLORIDE 0.9 % IV SOLN: 500.0000 mL | Freq: Once | INTRAVENOUS | Status: DC

## 2024-04-12 NOTE — Op Note (Signed)
 [facility] Patient Name: Joyce Mcclure Procedure Date: 04/12/2024 7:58 AM MRN: [id number] Endoscopist: Inocente Hausen , MD, [id number] Age: 44 Referring MD:  Date of Birth: [birth year] Gender: Female Account #: [id number] Procedure:                Colonoscopy Indications:              Rectal bleeding, Rectal pain, Weight loss, Gas,                            Anal fissure Medicines:                Monitored Anesthesia Care Procedure:                Pre-Anesthesia Assessment:                           - Prior to the procedure, a History and Physical                            was performed, and patient medications and                            allergies were reviewed. The patient's tolerance of                            previous anesthesia was also reviewed. The risks                            and benefits of the procedure and the sedation                            options and risks were discussed with the patient.                            All questions were answered, and informed consent                            was obtained. Prior Anticoagulants: The patient has                            taken no anticoagulant or antiplatelet agents. ASA                            Grade Assessment: II - A patient with mild systemic                            disease. After reviewing the risks and benefits,                            the patient was deemed in satisfactory condition to                            undergo the procedure.  After obtaining informed consent, the colonoscope                            was passed under direct vision. Throughout the                            procedure, the patient's blood pressure, pulse, and                            oxygen saturations were monitored continuously. The                            Olympus Scope SN [id number] was introduced through the                            anus and advanced to the terminal ileum. The                             colonoscopy was performed without difficulty. The                            patient tolerated the procedure well. The quality                            of the bowel preparation was adequate to identify                            polyps greater than 5 mm in size. The terminal                            ileum, ileocecal valve, appendiceal orifice, and                            rectum were photographed. Scope In: 8:23:39 AM Scope Out: 8:39:12 AM Scope Withdrawal Time: 0 hours 12 minutes 1 second  Total Procedure Duration: 0 hours 15 minutes 33 seconds  Findings:                 Hemorrhoids were found on perianal exam.                           The digital rectal exam was normal. Pertinent                            negatives include normal sphincter tone and no                            palpable rectal lesions.                           A moderate amount of semi-liquid stool was found in                            the entire colon. Lavage of the area was performed  using a large amount of sterile water, resulting in                            clearance with adequate visualization.                           The colon (entire examined portion) appeared normal.                           The terminal ileum appeared normal.                           Internal hemorrhoids were found during retroflexion. Complications:            No immediate complications. Estimated blood loss:                            None. Estimated Blood Loss:     Estimated blood loss: none. Impression:               - Hemorrhoids found on perianal exam.                           - Stool in the entire examined colon.                           - The entire examined colon is normal.                           - The examined portion of the ileum was normal.                           - Internal hemorrhoids.                           - No specimens  collected. Recommendation:           - Discharge patient to home.                           - Repeat colonoscopy in 10 years for screening                            purposes.                           - Optimize constipation management                           - Return to clinic in 4-6 weeks with Dr. Suzann or                            APP Oscar Coombs or [facility] May) to review                            constipation and hemorrhoid treatment.                           -  The findings and recommendations were discussed                            with the patient's family.                           - Patient has a contact number available for                            emergencies. The signs and symptoms of potential                            delayed complications were discussed with the                            patient. Return to normal activities tomorrow.                            Written discharge instructions were provided to the                            patient. Inocente Hausen, MD 04/12/2024 8:45:04 AM This report has been signed electronically.

## 2024-04-12 NOTE — Progress Notes (Signed)
 Pt's states no medical or surgical changes since previsit or office visit.

## 2024-04-12 NOTE — Telephone Encounter (Signed)
 [facility]    Initial Female Pelvic Medicine and Reconstructive Surgery Appointment      PATIENT:  Joyce Mcclure   MRN:  [id number]   DATE OF SERVICE:  02/29/2024, 10:48  REFERRING PROVIDER:   Leverne Carolyn HERO, MD  [address]  [place],  [place] [address]    CC:  No diagnosis found.     HPI:  Joyce Mcclure is a 44 y.o. [id number] who presents to clinic for an initial Uro/Gyn visit.     {Uro/Gyn [id number]      REVIEW OF SYSTEMS:   Negative as except on HPI    GYNECOLOGICAL HISTORY:  Gyn History       LMP: Progesterone IUD    Age at Menarche: 82    Age at First Pregnancy: 14    Age at Menopause:     Gyn History Comments:     Sexual Activity: Yes; Female    Contraception: None             OBSTETRICAL HISTORY:  OB History       Gravida   2    Para   2    Term   2    Preterm        AB        Living   2         SAB        IAB        Ectopic        Multiple        Live Births   2                 PAST MEDICAL HISTORY:  Past Medical History:   Diagnosis Date    Asthma     patient denies    Blood in stool     Dietary counseling and surveillance 05/11/2016    Dry skin     Ear piercing     Esophageal reflux     Headache 05/11/2016    HH (hiatus hernia)     HTN (hypertension) 05/11/2016    Joint pain 05/11/2016    Kidney stones     has passed on her own in the past    Morbid obesity due to excess calories (CMS HCC) 05/11/2016    Pre-diabetes 05/11/2016    Reflux     on medication and remains to have reflux at times    Sleep apnea     slight had recent sleep study no CPAP    Tattoo     Urinary incontinence     Vaginal prolapse     Wears glasses            PAST SURGICAL HISTORY:  Past Surgical History:   Procedure Laterality Date    HX BLADDER SUSPENSION  2009    mesh    HX CESAREAN SECTION      x 2    HX GASTRIC SLEEVE             FAMILY HISTORY:  Family Medical History:       Problem Relation (Age of Onset)    Breast Cancer Paternal Grandmother    Coronary  Artery Disease Father    Diabetes Mother    Heart Attack Father    Renal Failure Mother              SOCIAL HISTORY:  Social History     Socioeconomic  History    Marital status: Married     Spouse name: Not on file    Number of children: Not on file    Years of education: Not on file    Highest education level: Not on file   Occupational History    Not on file   Tobacco Use    Smoking status: Never     Passive exposure: Never    Smokeless tobacco: Never   Vaping Use    Vaping status: Never Used   Substance and Sexual Activity    Alcohol use: No    Drug use: No    Sexual activity: Yes     Partners: Male     Birth control/protection: None   Other Topics Concern    Ability to Walk 1 Flight of Steps without SOB/CP Yes    Routine Exercise No    Ability to Walk 2 Flight of Steps without SOB/CP Yes    Unable to Ambulate Not Asked    Total Care Not Asked    Ability To Do Own ADL's Yes    Uses Walker Not Asked    Other Activity Level Yes     Comment: helps with household, walks in the store no shortness of breath or CP     Uses Cane Not Asked   Social History Narrative    Not on file     Social Determinants of Health     Financial Resource Strain: Not on file   Transportation Needs: Not on file   Social Connections: Not on file   Intimate Partner Violence: Not on file   Housing Stability: Not on file        CURRENT MEDICATIONS:   Current Outpatient Medications   Medication Sig Dispense Refill    estradioL  (ESTRACE ) 0.01 % (0.1 mg/gram) Vaginal Cream Insert 0.5 g into the vagina Every night for 14 days, THEN 0.5 g Every Monday, Wednesday and Friday. 42.5 g 5    hydroCHLOROthiazide  (MICROZIDE ) 12.5 mg Oral Capsule Take 1 Capsule (12.5 mg total) by mouth Daily 90 Capsule 4    levonorgestrel  (MIRENA ) 20 mcg/24 hr intrauterine device by Intrauterine route One time      lisinopriL  (PRINIVIL ) 40 mg Oral Tablet Take 1 Tablet (40 mg total) by mouth Once a day 90 Tablet 3     No current facility-administered medications for this  visit.       ALLERGIES:  No known drug allergies     PHYSICAL EXAMINATION:   There were no vitals taken for this visit.         Physical Exam  Exam conducted with a chaperone present.   {General (Optional):[phone]::Alert,In no acute distress}  {Eyes (Optional):[phone]::Pupils equal, round, reactive to light}  {HENT (Optional):[phone]::Normocephalic,Atraumatic,Ear exam unremarkable,Nasal exam normal,Oral exam normal}  {Pulmonary (Optional):[phone]::Normal respiratory effort,Clear to auscultation bilaterally}  {Cardiovascular (Optional):[phone]::Well Perfused,RRR,No murmur appreciated}  {Abdominal/Gastrointestinal (Optional):[phone]::Soft,Non-distended,Non-tender,Bowel sounds normoactive}  {Extremities (Optional):[phone]::No cyanosis,No edema bilaterally}  {Integumentary (Optional):[phone]::Warm and dry,No rash}  {Neurologic (Optional):[phone]::Alert and oriented,CN II-XII grossly in tact,Normal speech}  {Psychiatric (Optional):[phone]::Normal affect and behavior}    Genitourinary:  Exam position: Lithotomy position.       Tanner stage (genital): {Tanner Stage:45265}      Vulva: {EXAM; GYN CLOCJ:83616}   Labia: {Labiafndgs:12199}   Bladder/Urethra: {BLADDER/URETHRA ZKJF:75247}   Vagina: {Exam; vagina:12200}   Cervix:  {Exam; cervix:14595}   Uterus:  {Exam; uterus:12215}   Adnexa:  {Exam; adnexa:12223}   Rectum: {URO_FEMALE DRE RECTUM POSITIVE FINDINGS (AMB):19091}    POP-Q: ***  POP-Q: Pelvic Organ Prolapse Interactive Assessment Tool      PVR:   Using a bladder scanner the post void residual was measured after the patient voided.    PVR: *** cc.    URINE DIP:  Urine dipstick shows {:5374}.    LABS:  LEUKOCYTES   Date Value Ref Range Status   02/20/2024 Large (A) Negative WBCs/uL Final     NITRITE   Date Value Ref Range Status   02/20/2024 Negative Negative Final     UROBILINOGEN   Date Value Ref Range Status   02/20/2024 2 (A) Negative  mg/dL Final     PROTEIN   Date Value Ref Range Status   02/20/2024 Negative Negative mg/dL Final     PH (VENOUS)   Date Value Ref Range Status   11/04/2015 7.38 7.31 - 7.41 Final     BILIRUBIN   Date Value Ref Range Status   02/20/2024 Negative Negative mg/dL Final     GLUCOSE   Date Value Ref Range Status   12/05/2023 127 (H) 65 - 125 mg/dL Final     Office Visit on 02/20/2024   Component Date Value Ref Range Status    SPECIFIC GRAVITY 02/20/2024 1.030  1.005 - 1.030 Final    GLUCOSE 02/20/2024 Negative  Negative mg/dL Final    PROTEIN 87/90/7974 Negative  Negative mg/dL Final    BILIRUBIN 87/90/7974 Negative  Negative mg/dL Final    UROBILINOGEN 87/90/7974 2 (A)  Negative mg/dL Final    PH 87/90/7974 6.5  5.0 - 8.0 Final    BLOOD 02/20/2024 Negative  Negative mg/dL Final    KETONES 87/90/7974 Negative  Negative mg/dL Final    NITRITE 87/90/7974 Negative  Negative Final    LEUKOCYTES 02/20/2024 Large (A)  Negative WBCs/uL Final    APPEARANCE 02/20/2024 Cloudy (A)  Clear Final    COLOR 02/20/2024 Normal (Yellow)  Normal (Yellow) Final    WBCS 02/20/2024 >182.0 (H)  <11.0 /hpf Final    RBCS 02/20/2024 0.0  <6.0 /hpf Final    BACTERIA 02/20/2024 Few (A)  Occasional or less /hpf Final    SQUAMOUS EPITHELIAL CELLS 02/20/2024 Occasional or less  Occasional or less /lpf Final    MUCOUS 02/20/2024 Light  Light /lpf Final    URINE CULTURE 02/20/2024 >100,000 CFU/mL Staphylococcus saprophyticus, Coagulase negative Staphylococcus (A)   Final    Routine susceptibility testing of S. saprophyticus in urine is not performed because infections should respond to antimicrobials commonly used to treat uncomplicated UTI (e.g. trim/sulfa, nitrofurantoin  and beta-lactams).     Appointment on 12/05/2023   Component Date Value Ref Range Status    HEMOGLOBIN A1C 12/05/2023 6.0 (H)  4.0 - 5.6 % Final    ESTIMATED AVERAGE GLUCOSE 12/05/2023 126  mg/dL Final    SODIUM 90/76/7974 138  136 - 145 mmol/L Final    POTASSIUM 12/05/2023 4.4  3.5 -  5.1 mmol/L Final    CHLORIDE 12/05/2023 103  96 - 111 mmol/L Final    CO2 TOTAL 12/05/2023 28  22 - 30 mmol/L Final    ANION GAP 12/05/2023 7  4 - 13 mmol/L Final    CALCIUM 12/05/2023 9.8  8.6 - 10.2 mg/dL Final    Gadolinium-containing contrast can interfere with calcium measurement.      GLUCOSE 12/05/2023 127 (H)  65 - 125 mg/dL Final    BUN 90/76/7974 19  8 - 25 mg/dL Final    CREATININE 90/76/7974 0.87  0.60 - 1.05 mg/dL Final  eGFRcr - FEMALE 12/05/2023 80  >=60 mL/min/1.80m2 Final    Estimated Glomerular Filtration Rate (eGFR) is calculated using the gender-dependent CKD-EPI (2021) equation, intended for patients 47 years of age and older.    If patient sex is not documented, or if patient sex and gender are incongruent, eGFR results calculated for both female and female will be reported. Recommend correlation and careful interpretation of patient history, keeping in mind that serum creatinine is influenced by hormone therapy.    Stage, GFR, Classification   G1, 90, Normal or High   G2, 60-89, Mildly decreased   G3a, 45-59, Mildly to moderately decreased   G3b, 30-44, Moderately to severely decreased   G4, 15-29, Severely decreased   G5, <15, Kidney failure   In the absence of kidney damage, neither G1 or G2 fulfill criteria for CKD per KDIGO.      BUN/CREA RATIO 12/05/2023 22  6 - 22 Final    ESTRADIOL  12/05/2023 <24  pg/mL Final    Follicular, <251 pg/mL  Peri-ovulatory, 38 - 649 pg/mL  Mid-luteal, <312 pg/mL  Postmenopausal (on HRT), <144 pg/mL  Postmenupausal (not on HRT), <28 pg/mL  HRT, hormone replacement therapy  Estradiol  methodology = chemiluminescent immunoassay performed on [employer] alinity    Estradiol  results are falsely and dramatically elevated in patients taking mifepristone (RU-486) for up to 2 weeks, based on the bioavailability of the compound.    FSH 12/05/2023 61.9  mIU/mL Final    Premenopausal Ranges          Follicular 3.0-8.1 mIU/mL          Periovulatory 2.6-16.7 mIU/mL           Luteal 1.4-5.5 mIU/mL     Postmenopausal Range 26.7-133.4 mIU/mL    ALT (SGPT) 12/05/2023 19  <31 U/L Final    AST (SGOT)  12/05/2023 24  11 - 34 U/L Final   Office Visit on 11/01/2023   Component Date Value Ref Range Status    SPECIFIC GRAVITY 11/01/2023 1.026  1.005 - 1.030 Final    GLUCOSE 11/01/2023 Negative  Negative mg/dL Final    PROTEIN 91/79/7974 Negative  Negative mg/dL Final    BILIRUBIN 91/79/7974 Negative  Negative mg/dL Final    UROBILINOGEN 91/79/7974 Negative  Negative mg/dL Final    PH 91/79/7974 5.5  5.0 - 8.0 Final    BLOOD 11/01/2023 Negative  Negative mg/dL Final    KETONES 91/79/7974 Negative  Negative mg/dL Final    NITRITE 91/79/7974 Positive (A)  Negative Final    LEUKOCYTES 11/01/2023 Moderate (A)  Negative WBCs/uL Final    APPEARANCE 11/01/2023 Turbid (A)  Clear Final    COLOR 11/01/2023 Orange (A)  Normal (Yellow) Final    WBCS 11/01/2023 40.0 (H)  <11.0 /hpf Final    RBCS 11/01/2023 2.0  <6.0 /hpf Final    BACTERIA 11/01/2023 Many (A)  Occasional or less /hpf Final    AMORPHOUS SEDIMENT 11/01/2023 Moderate (A)  Light /hpf Final    SQUAMOUS EPITHELIAL CELLS 11/01/2023 Several (A)  Occasional or less /lpf Final    MUCOUS 11/01/2023 Light  Light /lpf Final    URINE CULTURE 11/01/2023 >100,000 CFU/mL Escherichia coli (A)   Final   Appointment on 04/26/2023   Component Date Value Ref Range Status    CHOLESTEROL  04/26/2023 208 (H)  100 - 200 mg/dL Final    HDL CHOL 97/87/7974 80  >=50 mg/dL Final    TRIGLYCERIDES 04/26/2023 66  <150 mg/dL Final    LDL  CALC 04/26/2023 116 (H)  <100 mg/dL Final    <899 mg/dL, Optimal  899-870 mg/dL, Near/Above Optimal  869-840 mg/dL, Borderline High  839-810 mg/dL, High  >=809 mg/dL, Very high    VLDL CALC 04/26/2023 11  <30 mg/dL Final    NON-HDL 97/87/7974 128  <=190 mg/dL Final    CHOL/HDL RATIO 04/26/2023 2.6   Final    SODIUM 04/26/2023 142  136 - 145 mmol/L Final    POTASSIUM 04/26/2023 3.9  3.5 - 5.1 mmol/L Final    CHLORIDE 04/26/2023 108  96 - 111  mmol/L Final    CO2 TOTAL 04/26/2023 24  22 - 30 mmol/L Final    ANION GAP 04/26/2023 10  4 - 13 mmol/L Final    CALCIUM 04/26/2023 9.4  8.6 - 10.2 mg/dL Final    Gadolinium-containing contrast can interfere with calcium measurement.      GLUCOSE 04/26/2023 106 (H)  70 - 99 mg/dL Final    BUN 97/87/7974 17  8 - 25 mg/dL Final    CREATININE 97/87/7974 0.78  0.60 - 1.05 mg/dL Final    BUN/CREA RATIO 04/26/2023 22  6 - 22 Final    eGFRcr - FEMALE 04/26/2023 >90  >=60 mL/min/BSA Final    Estimated Glomerular Filtration Rate (eGFR) is calculated using the gender-dependent CKD-EPI (2021) equation, intended for patients 32 years of age and older.    If patient sex is not documented, or if patient sex and gender are incongruent, eGFR results calculated for both female and female will be reported. Recommend correlation and careful interpretation of patient history, keeping in mind that serum creatinine is influenced by hormone therapy.    Stage, GFR, Classification   G1, 90, Normal or High   G2, 60-89, Mildly decreased   G3a, 45-59, Mildly to moderately decreased   G3b, 30-44, Moderately to severely decreased   G4, 15-29, Severely decreased   G5, <15, Kidney failure   In the absence of kidney damage, neither G1 or G2 fulfill criteria for CKD per KDIGO.      IRON  04/26/2023 86  45 - 170 ug/dL Final         ASSESSMENT/PLAN:    44 y.o. G2P2002 with:    {Uro/Gyn Dx:57672}  {UROGYNOPTS:57632}  {Uro/Gyn Dx:57672}  {UROGYNOPTS:57632}  {Uro/Gyn Dx:57672}  {UROGYNOPTS:57632}            Patient is agreeable to the plan(s) as discussed above.  She has no further questions or concerns at this time.    On the day of the encounter, a total of  *** minutes was spent on this patient encounter including review of historical information, examination, documentation and post-visit activities.       Follow up: {follow up:15908}      Derrek Schneider MD PGY-4  02/29/2024 10:48 ***  [place]  [place]  Department of Obstetrics & Gynecology

## 2024-04-12 NOTE — Progress Notes (Signed)
 Report given to PACU, vss

## 2024-04-12 NOTE — Patient Instructions (Signed)

## 2024-04-16 ENCOUNTER — Telehealth: Payer: Self-pay | Admitting: Lactation Services

## 2024-04-16 NOTE — Telephone Encounter (Signed)
 No answer -no voice mail set up.

## 2024-04-17 ENCOUNTER — Ambulatory Visit
Admission: RE | Admit: 2024-04-17 | Discharge: 2024-04-17 | Disposition: A | Source: Ambulatory Visit | Attending: Pain Medicine | Admitting: Pain Medicine

## 2024-04-17 ENCOUNTER — Other Ambulatory Visit: Payer: Self-pay | Admitting: Pain Medicine

## 2024-04-17 DIAGNOSIS — N63 Unspecified lump in unspecified breast: Secondary | ICD-10-CM

## 2024-05-10 ENCOUNTER — Ambulatory Visit: Admitting: Pediatrics

## 2024-07-15 ENCOUNTER — Ambulatory Visit: Payer: Self-pay | Admitting: Neurology
